# Patient Record
Sex: Female | Born: 1980 | Race: Black or African American | Hispanic: No | Marital: Married | Smoking: Former smoker
Health system: Southern US, Community
[De-identification: ages and names within clinical notes are randomized; demographics above are authoritative.]

## PROBLEM LIST (undated history)

## (undated) ENCOUNTER — Inpatient Hospital Stay (HOSPITAL_COMMUNITY): Payer: Self-pay

## (undated) DIAGNOSIS — R12 Heartburn: Secondary | ICD-10-CM

## (undated) DIAGNOSIS — R21 Rash and other nonspecific skin eruption: Secondary | ICD-10-CM

## (undated) DIAGNOSIS — E669 Obesity, unspecified: Secondary | ICD-10-CM

## (undated) DIAGNOSIS — R203 Hyperesthesia: Secondary | ICD-10-CM

## (undated) DIAGNOSIS — M069 Rheumatoid arthritis, unspecified: Secondary | ICD-10-CM

## (undated) DIAGNOSIS — E1169 Type 2 diabetes mellitus with other specified complication: Secondary | ICD-10-CM

## (undated) DIAGNOSIS — D649 Anemia, unspecified: Secondary | ICD-10-CM

## (undated) DIAGNOSIS — Z9289 Personal history of other medical treatment: Secondary | ICD-10-CM

## (undated) HISTORY — PX: BREAST BIOPSY: SHX20

---
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
## 1999-02-24 ENCOUNTER — Encounter: Admission: RE | Admit: 1999-02-24 | Discharge: 1999-02-24 | Payer: Self-pay | Admitting: Family Medicine

## 2000-05-10 ENCOUNTER — Ambulatory Visit (HOSPITAL_COMMUNITY): Admission: RE | Admit: 2000-05-10 | Discharge: 2000-05-10 | Payer: Self-pay | Admitting: Family Medicine

## 2000-06-27 ENCOUNTER — Ambulatory Visit (HOSPITAL_COMMUNITY): Admission: RE | Admit: 2000-06-27 | Discharge: 2000-06-27 | Payer: Self-pay | Admitting: Family Medicine

## 2000-06-27 ENCOUNTER — Encounter: Payer: Self-pay | Admitting: Family Medicine

## 2000-07-10 HISTORY — PX: CERVICAL CERCLAGE: SHX1329

## 2000-07-21 ENCOUNTER — Inpatient Hospital Stay (HOSPITAL_COMMUNITY): Admission: AD | Admit: 2000-07-21 | Discharge: 2000-07-21 | Payer: Self-pay | Admitting: Obstetrics

## 2000-07-25 ENCOUNTER — Encounter: Admission: RE | Admit: 2000-07-25 | Discharge: 2000-07-25 | Payer: Self-pay | Admitting: Obstetrics

## 2000-08-01 ENCOUNTER — Encounter: Admission: RE | Admit: 2000-08-01 | Discharge: 2000-08-01 | Payer: Self-pay | Admitting: Obstetrics

## 2000-08-06 ENCOUNTER — Observation Stay (HOSPITAL_COMMUNITY): Admission: RE | Admit: 2000-08-06 | Discharge: 2000-08-07 | Payer: Self-pay | Admitting: *Deleted

## 2000-08-15 ENCOUNTER — Inpatient Hospital Stay (HOSPITAL_COMMUNITY): Admission: AD | Admit: 2000-08-15 | Discharge: 2000-08-15 | Payer: Self-pay | Admitting: Obstetrics & Gynecology

## 2000-08-22 ENCOUNTER — Encounter: Admission: RE | Admit: 2000-08-22 | Discharge: 2000-08-22 | Payer: Self-pay | Admitting: Obstetrics

## 2000-09-05 ENCOUNTER — Encounter: Admission: RE | Admit: 2000-09-05 | Discharge: 2000-09-05 | Payer: Self-pay | Admitting: Obstetrics

## 2000-09-19 ENCOUNTER — Encounter: Admission: RE | Admit: 2000-09-19 | Discharge: 2000-09-19 | Payer: Self-pay | Admitting: Obstetrics

## 2000-09-23 ENCOUNTER — Inpatient Hospital Stay (HOSPITAL_COMMUNITY): Admission: AD | Admit: 2000-09-23 | Discharge: 2000-11-25 | Payer: Self-pay | Admitting: *Deleted

## 2000-09-23 ENCOUNTER — Ambulatory Visit (HOSPITAL_COMMUNITY): Admission: RE | Admit: 2000-09-23 | Discharge: 2000-09-23 | Payer: Self-pay | Admitting: Obstetrics

## 2000-09-26 ENCOUNTER — Encounter: Payer: Self-pay | Admitting: Obstetrics & Gynecology

## 2000-10-13 ENCOUNTER — Encounter: Payer: Self-pay | Admitting: Obstetrics

## 2000-10-25 ENCOUNTER — Encounter: Payer: Self-pay | Admitting: *Deleted

## 2000-11-21 ENCOUNTER — Encounter: Payer: Self-pay | Admitting: Obstetrics & Gynecology

## 2000-12-03 ENCOUNTER — Encounter (HOSPITAL_COMMUNITY): Admission: RE | Admit: 2000-12-03 | Discharge: 2001-01-02 | Payer: Self-pay | Admitting: *Deleted

## 2000-12-27 ENCOUNTER — Inpatient Hospital Stay (HOSPITAL_COMMUNITY): Admission: AD | Admit: 2000-12-27 | Discharge: 2000-12-27 | Payer: Self-pay | Admitting: Obstetrics & Gynecology

## 2001-01-17 ENCOUNTER — Encounter (HOSPITAL_COMMUNITY): Admission: RE | Admit: 2001-01-17 | Discharge: 2001-01-20 | Payer: Self-pay | Admitting: *Deleted

## 2001-01-18 ENCOUNTER — Inpatient Hospital Stay (HOSPITAL_COMMUNITY): Admission: AD | Admit: 2001-01-18 | Discharge: 2001-01-21 | Payer: Self-pay | Admitting: *Deleted

## 2001-09-21 ENCOUNTER — Inpatient Hospital Stay (HOSPITAL_COMMUNITY): Admission: AD | Admit: 2001-09-21 | Discharge: 2001-09-21 | Payer: Self-pay | Admitting: *Deleted

## 2001-09-22 ENCOUNTER — Inpatient Hospital Stay (HOSPITAL_COMMUNITY): Admission: AD | Admit: 2001-09-22 | Discharge: 2001-09-22 | Payer: Self-pay | Admitting: Obstetrics and Gynecology

## 2001-09-22 ENCOUNTER — Encounter: Payer: Self-pay | Admitting: *Deleted

## 2001-09-23 ENCOUNTER — Observation Stay (HOSPITAL_COMMUNITY): Admission: RE | Admit: 2001-09-23 | Discharge: 2001-09-24 | Payer: Self-pay | Admitting: *Deleted

## 2001-09-30 ENCOUNTER — Encounter (HOSPITAL_COMMUNITY): Admission: RE | Admit: 2001-09-30 | Discharge: 2001-10-30 | Payer: Self-pay | Admitting: *Deleted

## 2001-10-28 ENCOUNTER — Encounter: Payer: Self-pay | Admitting: *Deleted

## 2001-10-30 HISTORY — PX: CERVICAL CERCLAGE: SHX1329

## 2001-11-04 ENCOUNTER — Encounter (HOSPITAL_COMMUNITY): Admission: RE | Admit: 2001-11-04 | Discharge: 2001-12-04 | Payer: Self-pay | Admitting: *Deleted

## 2001-12-09 ENCOUNTER — Encounter (HOSPITAL_COMMUNITY): Admission: RE | Admit: 2001-12-09 | Discharge: 2002-01-08 | Payer: Self-pay | Admitting: *Deleted

## 2002-01-06 ENCOUNTER — Encounter: Payer: Self-pay | Admitting: *Deleted

## 2002-01-09 ENCOUNTER — Inpatient Hospital Stay (HOSPITAL_COMMUNITY): Admission: AD | Admit: 2002-01-09 | Discharge: 2002-01-09 | Payer: Self-pay | Admitting: Obstetrics and Gynecology

## 2002-01-13 ENCOUNTER — Inpatient Hospital Stay (HOSPITAL_COMMUNITY): Admission: AD | Admit: 2002-01-13 | Discharge: 2002-01-15 | Payer: Self-pay | Admitting: *Deleted

## 2002-01-13 ENCOUNTER — Encounter (HOSPITAL_COMMUNITY): Admission: RE | Admit: 2002-01-13 | Discharge: 2002-01-23 | Payer: Self-pay | Admitting: *Deleted

## 2002-01-28 ENCOUNTER — Encounter: Admission: RE | Admit: 2002-01-28 | Discharge: 2002-01-28 | Payer: Self-pay | Admitting: *Deleted

## 2002-01-28 ENCOUNTER — Encounter (HOSPITAL_COMMUNITY): Admission: RE | Admit: 2002-01-28 | Discharge: 2002-02-27 | Payer: Self-pay | Admitting: *Deleted

## 2002-01-29 ENCOUNTER — Inpatient Hospital Stay (HOSPITAL_COMMUNITY): Admission: AD | Admit: 2002-01-29 | Discharge: 2002-01-29 | Payer: Self-pay | Admitting: *Deleted

## 2002-02-02 ENCOUNTER — Inpatient Hospital Stay (HOSPITAL_COMMUNITY): Admission: AD | Admit: 2002-02-02 | Discharge: 2002-02-05 | Payer: Self-pay | Admitting: Obstetrics and Gynecology

## 2002-02-12 ENCOUNTER — Encounter: Admission: RE | Admit: 2002-02-12 | Discharge: 2002-02-12 | Payer: Self-pay | Admitting: *Deleted

## 2002-02-19 ENCOUNTER — Encounter: Admission: RE | Admit: 2002-02-19 | Discharge: 2002-02-19 | Payer: Self-pay | Admitting: *Deleted

## 2002-02-26 ENCOUNTER — Encounter: Admission: RE | Admit: 2002-02-26 | Discharge: 2002-02-26 | Payer: Self-pay | Admitting: *Deleted

## 2002-03-05 ENCOUNTER — Encounter: Admission: RE | Admit: 2002-03-05 | Discharge: 2002-03-05 | Payer: Self-pay | Admitting: *Deleted

## 2002-03-12 ENCOUNTER — Encounter: Admission: RE | Admit: 2002-03-12 | Discharge: 2002-03-12 | Payer: Self-pay | Admitting: *Deleted

## 2002-03-14 ENCOUNTER — Inpatient Hospital Stay (HOSPITAL_COMMUNITY): Admission: AD | Admit: 2002-03-14 | Discharge: 2002-03-14 | Payer: Self-pay | Admitting: Family Medicine

## 2002-03-26 ENCOUNTER — Inpatient Hospital Stay (HOSPITAL_COMMUNITY): Admission: AD | Admit: 2002-03-26 | Discharge: 2002-03-26 | Payer: Self-pay | Admitting: Family Medicine

## 2002-03-27 ENCOUNTER — Inpatient Hospital Stay (HOSPITAL_COMMUNITY): Admission: AD | Admit: 2002-03-27 | Discharge: 2002-03-27 | Payer: Self-pay | Admitting: Family Medicine

## 2002-03-28 ENCOUNTER — Inpatient Hospital Stay (HOSPITAL_COMMUNITY): Admission: AD | Admit: 2002-03-28 | Discharge: 2002-03-30 | Payer: Self-pay | Admitting: *Deleted

## 2004-04-28 ENCOUNTER — Ambulatory Visit: Payer: Self-pay | Admitting: Family Medicine

## 2004-04-28 ENCOUNTER — Ambulatory Visit: Payer: Self-pay | Admitting: *Deleted

## 2005-05-31 ENCOUNTER — Ambulatory Visit: Payer: Self-pay | Admitting: Family Medicine

## 2005-07-08 ENCOUNTER — Emergency Department (HOSPITAL_COMMUNITY): Admission: EM | Admit: 2005-07-08 | Discharge: 2005-07-08 | Payer: Self-pay | Admitting: Emergency Medicine

## 2006-04-04 ENCOUNTER — Inpatient Hospital Stay (HOSPITAL_COMMUNITY): Admission: AD | Admit: 2006-04-04 | Discharge: 2006-04-05 | Payer: Self-pay | Admitting: Gynecology

## 2006-04-17 ENCOUNTER — Ambulatory Visit (HOSPITAL_COMMUNITY): Admission: RE | Admit: 2006-04-17 | Discharge: 2006-04-17 | Payer: Self-pay | Admitting: Obstetrics

## 2006-05-01 ENCOUNTER — Ambulatory Visit (HOSPITAL_COMMUNITY): Admission: RE | Admit: 2006-05-01 | Discharge: 2006-05-01 | Payer: Self-pay | Admitting: Obstetrics

## 2006-05-02 ENCOUNTER — Ambulatory Visit (HOSPITAL_COMMUNITY): Admission: RE | Admit: 2006-05-02 | Discharge: 2006-05-03 | Payer: Self-pay | Admitting: Obstetrics

## 2006-05-02 HISTORY — PX: CERVICAL CERCLAGE: SHX1329

## 2006-05-09 ENCOUNTER — Encounter (HOSPITAL_COMMUNITY): Admission: AD | Admit: 2006-05-09 | Discharge: 2006-05-09 | Payer: Self-pay | Admitting: Obstetrics

## 2006-06-16 ENCOUNTER — Inpatient Hospital Stay (HOSPITAL_COMMUNITY): Admission: AD | Admit: 2006-06-16 | Discharge: 2006-06-21 | Payer: Self-pay | Admitting: Obstetrics

## 2006-07-18 ENCOUNTER — Inpatient Hospital Stay (HOSPITAL_COMMUNITY): Admission: AD | Admit: 2006-07-18 | Discharge: 2006-07-18 | Payer: Self-pay | Admitting: Obstetrics & Gynecology

## 2006-09-12 ENCOUNTER — Inpatient Hospital Stay (HOSPITAL_COMMUNITY): Admission: AD | Admit: 2006-09-12 | Discharge: 2006-09-12 | Payer: Self-pay | Admitting: Obstetrics

## 2006-09-13 ENCOUNTER — Inpatient Hospital Stay (HOSPITAL_COMMUNITY): Admission: AD | Admit: 2006-09-13 | Discharge: 2006-09-14 | Payer: Self-pay | Admitting: Obstetrics & Gynecology

## 2006-09-30 ENCOUNTER — Inpatient Hospital Stay (HOSPITAL_COMMUNITY): Admission: AD | Admit: 2006-09-30 | Discharge: 2006-10-02 | Payer: Self-pay | Admitting: Obstetrics

## 2007-01-12 ENCOUNTER — Emergency Department (HOSPITAL_COMMUNITY): Admission: EM | Admit: 2007-01-12 | Discharge: 2007-01-12 | Payer: Self-pay | Admitting: Emergency Medicine

## 2009-10-20 ENCOUNTER — Emergency Department (HOSPITAL_COMMUNITY): Admission: EM | Admit: 2009-10-20 | Discharge: 2009-10-20 | Payer: Self-pay | Admitting: Emergency Medicine

## 2010-03-23 LAB — DIFFERENTIAL
Basophils Absolute: 0.1 10*3/uL (ref 0.0–0.1)
Basophils Relative: 1 % (ref 0–1)
Eosinophils Absolute: 0.1 10*3/uL (ref 0.0–0.7)
Lymphocytes Relative: 30 % (ref 12–46)
Neutrophils Relative %: 59 % (ref 43–77)

## 2010-03-23 LAB — COMPREHENSIVE METABOLIC PANEL
Albumin: 3.7 g/dL (ref 3.5–5.2)
BUN: 11 mg/dL (ref 6–23)
Chloride: 105 mEq/L (ref 96–112)
Creatinine, Ser: 0.78 mg/dL (ref 0.4–1.2)
GFR calc non Af Amer: >60 mL/min (ref >60)
Glucose, Bld: 94 mg/dL (ref 70–99)
Total Bilirubin: 0.2 mg/dL — ABNORMAL LOW (ref 0.3–1.2)

## 2010-03-23 LAB — CBC
HCT: 25.2 % — ABNORMAL LOW (ref 36.0–46.0)
MCV: 64.3 fL — ABNORMAL LOW (ref 78.0–100.0)
RDW: 20.2 % — ABNORMAL HIGH (ref 11.5–15.5)
WBC: 6 10*3/uL (ref 4.0–10.5)

## 2010-03-23 LAB — URINALYSIS, ROUTINE W REFLEX MICROSCOPIC
Ketones, ur: NEGATIVE mg/dL
Nitrite: NEGATIVE
Specific Gravity, Urine: 1.014 (ref 1.005–1.030)
pH: 7.5 (ref 5.0–8.0)

## 2010-03-23 LAB — URINE MICROSCOPIC-ADD ON

## 2010-03-23 LAB — PREGNANCY, URINE

## 2010-04-19 ENCOUNTER — Emergency Department (HOSPITAL_COMMUNITY)
Admission: EM | Admit: 2010-04-19 | Discharge: 2010-04-19 | Disposition: A | Discharge status: Home / Self Care | Payer: Self-pay | Attending: Emergency Medicine | Admitting: Emergency Medicine

## 2010-04-19 ENCOUNTER — Emergency Department (HOSPITAL_COMMUNITY): Payer: Self-pay

## 2010-04-19 DIAGNOSIS — R059 Cough, unspecified: Secondary | ICD-10-CM | POA: Insufficient documentation

## 2010-04-19 DIAGNOSIS — R05 Cough: Secondary | ICD-10-CM | POA: Insufficient documentation

## 2010-04-19 DIAGNOSIS — J3489 Other specified disorders of nose and nasal sinuses: Secondary | ICD-10-CM | POA: Insufficient documentation

## 2010-04-19 DIAGNOSIS — R071 Chest pain on breathing: Secondary | ICD-10-CM | POA: Insufficient documentation

## 2010-04-19 DIAGNOSIS — J40 Bronchitis, not specified as acute or chronic: Secondary | ICD-10-CM | POA: Insufficient documentation

## 2010-04-19 DIAGNOSIS — IMO0001 Reserved for inherently not codable concepts without codable children: Secondary | ICD-10-CM | POA: Insufficient documentation

## 2010-04-19 DIAGNOSIS — R509 Fever, unspecified: Secondary | ICD-10-CM | POA: Insufficient documentation

## 2010-05-23 NOTE — H&P (Signed)
NAME:  Tamara Green, Tamara Green NO.:  [id number]   MEDICAL RECORD NO.:  [id number]          PATIENT TYPE:  INP   LOCATION:  9161                          FACILITY:  [facility]   PHYSICIAN:  Roseanna Rainbow, M.D.DATE OF BIRTH:  26-Nov-1980   DATE OF ADMISSION:  09/30/2006  DATE OF DISCHARGE:                              HISTORY & PHYSICAL   CHIEF COMPLAINT:  The patient is a 30 year old gravida 5 para 2 with an  estimated date of confinement September 26, with an intrauterine  pregnancy at 39+ weeks, complaining of uterine contractions.   HISTORY OF PRESENT ILLNESS:  Please see the above.   ALLERGIES:  DARVOCET   PRENATAL SCREENINGS:  Chlamydia DNA probe negative.  Urine culture and  sensitivity no growth.  GC probe negative.  One-hour GCT 154.  CVS  negative on August 26.  Hepatitis B surface antigen negative.  Hematocrit 28.8.  Hemoglobin 9.8.  Recent hemoglobin on July 8 was 8.8.   MEDICATIONS:  Please see the medication reconciliation form.   OB RISK FACTORS:  Cervical insufficiency, history of a pre-term  delivery.  History of intrauterine fetal demise.  History of large  gestational age infant, history of gestational diabetes.  Anemia.  RH  negative, nonsensitized.   PAST OBSTETRICAL HISTORY:  In 1999, she was delivered of a  21-week  fetal demise.  In January 2000, she was delivered of a 21-week fetal  demise.  In January of 2003, she was delivered of a live-born female, 6  pounds 12 ounces at 35 weeks.  A cerclage  had been placed  prophylactically and she had gestational diabetes.  In March of 2004 she  was delivered of a live-born female 9 pounds 6 ounce infant with a  cerclage placed prophylactically during that pregnancy.  HIV is  nonreactive.  Pap smear H cell, platelets 306,000.  Blood type is O  negative.  Antibody screen negative.  RPR was nonreactive.  Rubella  immune.  Sickle Cell negative.  Varicella immune.   PAST GYN HISTORY:  Noncontributory.   PAST MEDICAL HISTORY:  No significant history of medical diseases.   PAST SURGICAL HISTORY:  Benign right breast biopsy.  Please see the  above.   SOCIAL HISTORY:  She is  married, living with her spouse, does not give  any significant history of alcohol use.  She has no significant smoking  history.  Denies illicit drug use.   FAMILY HISTORY:  Adult-onset diabetes, prostate cancer.   PHYSICAL EXAMINATION:  Vital signs stable, afebrile.  Fetal heart rate  seems reassuring.  Tocodynamometry, irregular uterine contractions,  sterile vaginal exam.  Cervix is 4-5 cm dilated, 60-80% effaced.  Membranes were artificially ruptured for clear fluid.   ASSESSMENT:  Multipara at term, early labor, fetal heart tracing  consistent with fetal wall being planned.  Admission, augmentation of  labor with low-dose Pitocin per protocol.  Will monitor progress.      Roseanna Rainbow, M.D.  Electronically Signed     LAJ/MEDQ  D:  09/30/2006  T:  09/30/2006  Job:  [id number]

## 2010-05-23 NOTE — Consult Note (Signed)
NAME:  Tamara Green, Tamara Green NO.:  [id number]   MEDICAL RECORD NO.:  [id number]          PATIENT TYPE:   LOCATION:                                 FACILITY:   PHYSICIAN:  Karol T. Lazarus Salines, M.D.      DATE OF BIRTH:   DATE OF CONSULTATION:  01/12/2007  DATE OF DISCHARGE:                                 CONSULTATION   CHIEF COMPLAINT:  Sore throat.   HISTORY:  A 30 year old black female started having a sore throat  approximately 9 days ago.  It seemed to ease up after a couple days and  then has been getting slowly worse.  The last couple of days, it has  been focal on the right side of her throat with some tenderness in her  right neck, pain shooting into her right ear, trismus, and pain with  swallowing.  She was taking some liquids.  No documented fevers.  She  has maybe one episode of sore throat per year.  This time she is strep  positive.  According to her and her husband, she does snore regularly  with or without a sore throat.  She has never had a prior peritonsillar  abscess.  She is not diabetic or otherwise immune compromised.   PAST MEDICAL HISTORY:  No known allergies.  She has not received any  treatment for this thus far.   SOCIAL HISTORY:  She had just had a 76-month-old baby.  She lives with  her husband.   FAMILY HISTORY:  Noncontributory.   REVIEW OF SYSTEMS:  Noncontributory.   PHYSICAL EXAMINATION:  GENERAL:  This is a heavy-set, distressed  appearing, adult black female.  She has a strong fetor oris consistent  with anaerobic infection.  She is not warm to touch.  Mental status is  appropriate.  She hears well in conversational speech.  Respirations  unlabored, but voice has a hot potato sound.  HEENT:  Head is atraumatic.  Cranial nerves intact.  Ear canals are  clear with normal aerated drums.  Anterior nose is clear.  Oral cavity  reveals a bulky tongue with normal mandible maxilla.  She has a fleshy  pharynx with edematous bulging of the  right soft palate and shift of the  tonsil and the uvula towards the left.  No active exudate seen.  NECK:  Supple, tender in the right jugulodigastric region without  discrete nodes.   LABORATORY STUDIES:  White blood cell count 11,500.   X-RAYS:  A CT scan of the pharynx shows a large phlegmon but no discrete  abscess.   IMPRESSION:  Right peritonsillar abscess more obvious clinically done on  CT scan.   PLAN:  I discussed this with her husband.  Today, we need to drain this  and I would prefer to do this here at the bedside.  She is agreeable.   Risk and complications were discussed.  Questions were answered and  informed consent was obtained.  Anesthesia was begun with Cetacaine  spray followed by 8 mL of 1% Xylocaine with 1:100,000 epinephrine.  After allowing several minutes for this to take effect, a 2-cm crescent  incision was  made over the superior pole of the right tonsil and carried  down and an abscess cavity was encountered and evacuated.  The opening  was enlarged with the hemostat which caused her some discomfort.  Cavity  was thoroughly evacuated.  Following the procedure, she gargled with a  50/50 mixture of peroxide and saline.   I gave her a prescription for Tylenol with Codeine liquid pain relief,  amoxicillin liquid 1 gram t.i.d. for 3 days followed by amoxicillin 500  mg in pill form for an additional week.  I will see her back in 2 weeks  if she is doing well, sooner for problems.  She can advance activity and  diet as comfortable.  Given the past history of at least some episodes  of strep throat and tonsillitis, and a suggestion of some obstructive  hypertrophy, she may be better served to have her tonsils removed.  We  will discuss this further with I see her back.  She understands and  agrees.      Gloris Manchester. Lazarus Salines, M.D.  Electronically Signed     KTW/MEDQ  D:  01/12/2007  T:  01/13/2007  Job:  [id number]

## 2010-05-26 NOTE — Discharge Summary (Signed)
   NAME:  Tamara Green, Tamara Green                ACCOUNT NO.:  [id number]   MEDICAL RECORD NO.:  [id number]                   PATIENT TYPE:  INP   LOCATION:  9156                                 FACILITY:  [facility]   PHYSICIAN:  Conni Elliot, M.D.             DATE OF BIRTH:  22-Jan-1980   DATE OF ADMISSION:  01/13/2002  DATE OF DISCHARGE:  01/15/2002                                 DISCHARGE SUMMARY   DISCHARGE DIAGNOSES:  1. Gravida 4, para 1-2-0-1 at 29 weeks and 3 days with preterm uterine     contractions.  2. Status post cerclage in September 2003.  3. Positive group B Strep.  4. Anemia.   DISCHARGE MEDICATIONS:  Prenatal vitamins daily.   FOLLOW UP:  The patient is to follow up with Conni Elliot, M.D. on  Tuesday at 11 a.m.   HOSPITAL COURSE:  This 30 year old gravida 4, para 1-2-0-1 presented at 71  weeks and 0 days gestational age with complaints of pain on her right side  and told to come in for evaluation to [facility].  The patient had  been as far as multiple times with preterm labor and was now admitted for  observation for preterm labor as well as to receive IV Unasyn for positive  group B Strep.  The patient was monitored for 48 hours and had no more  uterine contractions during hospitalization.  She completed a two day course  of Unasyn for group B Strep.  She did have microcytic anemia on examination,  but was not started on ferrous sulfate.  She was discharged on prenatal  vitamins and to follow up with Conni Elliot, M.D.   LABORATORY DATA:  White cell count 7.1, hemoglobin 8.8, hematocrit 26.8,  platelets 270,000.     Billey Gosling, M.D.                       Conni Elliot, M.D.    AS/MEDQ  D:  03/02/2002  T:  03/02/2002  Job:  [id number]

## 2010-05-26 NOTE — Op Note (Signed)
[facility]  Patient:    Tamara Green, Tamara Green                      MRN: [id number] Proc. Date: 07/10/00 Adm. Date:  [id number] Attending:  Michaelle Copas                           Operative Report  2PREOPERATIVE DIAGNOSIS:       Intrauterine pregnancy at 11 to [redacted] weeks gestation with decreased cervical resistance.  POSTOPERATIVE DIAGNOSIS:      Intrauterine pregnancy at 11 to [redacted] weeks gestation with decreased cervical resistance.  OPERATION:                    Modified [employer] cerclage.  SURGEON:                      Conni Elliot, M.D.  ANESTHESIA:                   Spinal.  DESCRIPTION OF PROCEDURE:     After placing the patient under spinal anesthetic, the patient was placed in the supine dorsal lithotomy position. The perineum and vagina were prepped with multiple layers of Betadine solution. A weighted speculum was placed in the posterior vagina. Cleocin douche was provided. Anterior cervix was grasped with a sponge stick. The cerclage was placed using #4 silk, double stranded, starting at 12 oclock in the counterclockwise fashion. This was tied anteriorly. Estimated blood loss was less than 5 cc. Needle and sponge counts correct. DD:  08/06/00 TD:  08/06/00 Job: [id number]

## 2010-05-26 NOTE — Discharge Summary (Signed)
   NAME:  Tamara Green, Tamara Green                ACCOUNT NO.:  [id number]   MEDICAL RECORD NO.:  [id number]                   PATIENT TYPE:  INP   LOCATION:  9152                                 FACILITY:  [facility]   PHYSICIAN:  Phil D. Okey Dupre, M.D.                  DATE OF BIRTH:  06-16-1980   DATE OF ADMISSION:  02/02/2002  DATE OF DISCHARGE:  02/05/2002                                 DISCHARGE SUMMARY   DISCHARGE DIAGNOSES:  1. Preterm uterine contractions.  2. Status post Cerclage for cervical incompetence.   DISCHARGE MEDICATIONS:  1. Procardia 10 mg p.o. q.6h.  2. Prenatal vitamins 1 tablet p.o. daily.   BRIEF HISTORY:  The patient is a 30 year old G4, P0-3-0-1, who presents at  32 weeks 0 days with the complaint of contracting all day.  She describes  irregular low pelvic and low back pain and the feeling of balling up in the  area of the fundus.  She saw Dr. Gavin Potters at high-risk clinic and evaluated  for her cervix to be 2 cm long, full thickness, and ballottable.  She was  noted to be having some contractions and referred to [facility] for  admission.   HOSPITAL COURSE:  PRETERM LABOR:  Upon arrival to [facility], she was given terbutaline  0.25 mg subcutaneously and then started on Procardia 10 mg p.o. q.6h.  Her  uterine contractions essentially stopped while in the hospital, and she  would only have occasional intermittent contraction.  Throughout her  hospital stay, fetal heart rate was reassuring, and she will be discharged  home on Procardia.  On admission, she did received both doses of  betamethasone while in the hospital.   DIET:  No restrictions.   ACTIVITY:  The patient was advised to use bedrest.   FOLLOW UP:  The patient was advised to call for appointment at high-risk  clinic.      Rodolph Bong, M.D.                        Phil D. Okey Dupre, M.D.    AK/MEDQ  D:  02/05/2002  T:  02/05/2002  Job:  [id number]

## 2010-05-26 NOTE — Op Note (Signed)
   NAME:  SHATAYA, WINKLES NO.:  [id number]   MEDICAL RECORD NO.:  [id number]                   PATIENT TYPE:   LOCATION:                                       FACILITY:   PHYSICIAN:  Conni Elliot, M.D.             DATE OF BIRTH:   DATE OF PROCEDURE:  DATE OF DISCHARGE:                                 OPERATIVE REPORT   PREOPERATIVE DIAGNOSES:  Decreased cervical resistance.   POSTOPERATIVE DIAGNOSES:  Decreased cervical resistance.   OPERATIVE PROCEDURE:  Cervical cerclage.   OPERATOR:  Conni Elliot, M.D.   ANESTHESIA:  Spinal.   OPERATIVE PROCEDURE:  After the patient was placed in dorsal lithotomy  position, perineum and vagina were prepped with Betadine solution.  Weighted  speculum was placed in the posterior vagina.  Foley catheter was placed in  the bladder.  Anterior cervix was grasped with sponge stick.  A cervical  cerclage was placed using number 4 silk double stranded.  Cerclage was  placed starting at 12 o'clock going counterclockwise, tied at 12 o'clock.   ESTIMATED BLOOD LOSS:  Less than 50 cc.                                                Conni Elliot, M.D.    ASG/MEDQ  D:  10/30/2001  T:  10/30/2001  Job:  [id number]

## 2010-05-26 NOTE — Op Note (Signed)
NAME:  Tamara Green, Tamara Green NO.:  [id number]   MEDICAL RECORD NO.:  [id number]          PATIENT TYPE:  AMB   LOCATION:  SDC                           [facility]ACILITY:  [facility]   PHYSICIAN:  Charles A. Clearance Coots, M.D.DATE OF BIRTH:  1980-05-01   DATE OF PROCEDURE:  05/02/2006  DATE OF DISCHARGE:                               OPERATIVE REPORT   PREOPERATIVE DIAGNOSIS:  17 weeks' gestation, incompetent cervix.   POSTOPERATIVE DIAGNOSIS:  17 weeks' gestation, incompetent cervix.   PROCEDURE:  [employer] cervical cerclage.   SURGEON:  Coral Ceo, M.D.   ANESTHESIA:  Spinal.   ESTIMATED BLOOD LOSS:  Minimal.   COMPLICATIONS:  None.   OPERATION:  The patient was brought to the operating room and after  satisfactory spinal anesthesia, the patient was placed in stirrups and  the vagina was prepped and draped in the usual sterile fashion.  A  sterile weighted speculum was inserted vaginal vault, cervix was  isolated with the aid of a Deaver retractor.  The anterior lip of the  cervix was grasped with a ring forceps.  A [employer] cervical cerclage  was then placed in routine fashion with double swaged on #4 silk suture  with a medium Mayo needle starting at 12 o'clock and then going  counterclockwise.  The suture was then cinched down securely and  multiple knots were placed and the suture was left approximately 1 inch  in length for future removal of the cerclage.  There was no active  bleeding at the conclusion of the procedure.  The vagina was then  douched with a solution of 150 mg capsule of clindamycin and 30 mL of  normal saline.  All instruments were then retired.  The patient  tolerated the procedure well, was transported to the recovery room in  satisfactory condition.      Charles A. Clearance Coots, M.D.  Electronically Signed     CAH/MEDQ  D:  05/02/2006  T:  05/02/2006  Job:  [id number]

## 2010-05-26 NOTE — Discharge Summary (Signed)
[facility]  Patient:    Tamara Green, Tamara Green Visit Number: [id number] MRN: [id number]          Service Type: Attending:  Conni Elliot, M.D. Dictated by:   Michell Heinrich, M.D. Adm. Date:  09/23/00 Disc. Date: 11/25/00                             Discharge Summary  ADMISSION DIAGNOSES:             1. Cervical incompetence.                                  2. Preterm labor.                                  3. Cervicitis.  DISCHARGE DIAGNOSES:             1. Cervical incompetence.                                  2. Preterm labor.                                  3. Cervicitis.                                  4. Gestational diabetes mellitus.  DISCHARGE MEDICATIONS:           1. Procardia XL 60 mg one tab daily.                                  2. Insulin NPH 10 units in the morning and                                     12 units every evening.  SERVICE:                         OB Teaching Service.  ATTENDING:                       Conni Elliot, M.D.  RESIDENT:                        Michell Heinrich, M.D.  FELLOW:                          Ed Blalock. Burnadette Peter, M.D.  CONSULTATIONS:                   None.  PROCEDURES:                      1. Limited OB ultrasound on September 23, 2000 showed a cervical length of 2.3 cm                                     with cerclage in place and 0.7 cm length                                     proximal to the cerclage with dynamic                                     changes.                                  2. Limited OB ultrasound on October 13, 2000                                     showed single intrauterine pregnancy in                                     breech position.  Also a shortened cervix                                     with funneling seen down to the level of                                     the intact cerclage.           3. Complete OB ultrasound on November 21, 2000 showed appropriate interval growth,                                     estimated fetal weight of 1121 g which was                                     65th percentile for 27 weeks.                                     Presentation was breech.  Amniotic fluid                                     was normal.  Again, shortened cervical  length was noted.  HISTORY AND PHYSICAL:            For complete H&P, please see resident H&P in chart.  Briefly, this is a 30 year old female G3, P0-1-1-0 with a history of two prior second-trimester fetal losses and placement of a cervical cerclage at 12 weeks this pregnancy who presented for routine anatomic ultrasound.  She was not having any contractions or vaginal bleeding.  Her cervix was noted to be shortened and have dynamic changes.  The remainder of the physical exam was normal and fetal heart rate was in the 150s and was reassuring.  She was admitted to the hospital for cervical weakness and no dynamic cervical changes and hospital course is as follows by problem.  HOSPITAL COURSE:                 #1 - CERVICAL INCOMPETENCE/PRETERM LABOR: Initial lab work supported a diagnosis of cervicitis and she was begun on Unasyn.  She was also placed on Motrin, placed in Trendelenburg on complete bed rest and monitored closely.  She continued to have mild uterine activity on tocometry but serial cervical checks did not reveal any significant change. Over the course of the hospitalization, her cervix lengthened by exam to approximately 2 cm in length and was thought to be thickened and less dynamic. Her uterine activity diminished.  She finished a course of Unasyn and was discharged home on Procardia.                                   #2 - GESTATIONAL DIABETES MELLITUS:  The patient on admission was at 19-4/7 weeks by first-trimester  ultrasound and stayed in the hospital approximately 2 months.  At the 24-week mark she did receive a 1-hour glucose tolerance test which was 135.  Subsequent fasting blood sugars were borderline and it became apparent that she was developing gestational diabetes mellitus.  She was begun on nightly NPH Insulin and subsequent checks of her daytime sugars revealed she needed daytime dosing of NPH as well.  CBGs were in good control with the doses of insulin during the hospitalization and upon discharge.  She was discharged home on the previously mentioned dose of insulin.                                   #3 - FETAL MONITORING:  The fetal heart rate monitoring during this hospitalization revealed reassuring fetal heart tracings and growth findings were as dictated in the procedures section of this dictation.  The patient was discharged home on November 25, 2000 with instructions for complete bed rest and to eat a diabetic diet and to follow up in [facility]U at [facility] to see Dr. Gavin Potters every Tuesday beginning December 03, 2000 at 10:30 a.m. Dictated by:   Michell Heinrich, M.D. Attending:  Conni Elliot, M.D. DD:  06/05/01 TD:  06/07/01 Job: [id number]

## 2010-05-26 NOTE — Discharge Summary (Signed)
NAME:  Tamara Green, Tamara Green NO.:  [id number]   MEDICAL RECORD NO.:  [id number]          PATIENT TYPE:  INP   LOCATION:  9158                          [facility]ACILITY:  [facility]   PHYSICIAN:  Roseanna Rainbow, M.D.DATE OF BIRTH:  1980-08-13   DATE OF ADMISSION:  06/16/2006  DATE OF DISCHARGE:  06/21/2006                               DISCHARGE SUMMARY   CHIEF COMPLAINT:  The patient is a 30 year old, gravida 5, para 2 with  an estimated date of confinement of October 04, 2006 with an  intrauterine pregnancy at 24 weeks complaining of contractions.   HISTORY OF PRESENT ILLNESS:  The patient reports recent sexual  intercourse.  OB risk factors:  Cervical insufficiency with a cerclage  in situ.   PAST SURGICAL HISTORY:  Cerclage.   PAST MEDICAL HISTORY:  She denies.   MEDICATIONS:  Prenatal vitamins.   ALLERGIES:  No known drug allergies.   SOCIAL HISTORY:  She is single.   On physical exam, vital signs stable, afebrile.  HEART:  Regular rate and rhythm.  ABDOMEN:  Gravid, nontender.  CERVIX:  Closed, long, with the presenting part high.  External fetal  monitoring reassuring.  Uterine contractions every 6 to 8 minutes.   ASSESSMENT:  Intrauterine pregnancy at 24+ weeks.  Cervical  insufficiency.  Now with threatened preterm labor.   PLAN:  Admission, bedrest, tocolysis.   The patient was admitted.  Her 17-hydroxyprogesterone injections were  continued.  She was started on clindamycin parenterally.  She continued  to have irregular contractions.  An ultrasound on June 13 demonstrated a  normal amniotic fluid index, estimated fetal weight percentile 61st  percentile.  The cervix was 4.18 cm in length.  She was subsequently  discharged to home.   DISCHARGE DIAGNOSES:  Cervical insufficiency, preterm contractions.   CONDITION:  Stable.   DIET:  Regular.   ACTIVITY:  Pelvic rest, decreased activity.   DISPOSITION:  The patient was to follow up in the office in  1 week.      Roseanna Rainbow, M.D.  Electronically Signed     LAJ/MEDQ  D:  07/26/2006  T:  07/27/2006  Job:  [id number]

## 2010-09-28 LAB — I-STAT 8, (EC8 V) (CONVERTED LAB)
BUN: 9
Chloride: 105
Glucose, Bld: 117 — ABNORMAL HIGH
HCT: 35 — ABNORMAL LOW
Hemoglobin: 11.9 — ABNORMAL LOW
Operator id: 151321
Potassium: 3.5
Sodium: 139

## 2010-09-28 LAB — DIFFERENTIAL
Basophils Absolute: 0
Basophils Relative: 0
Lymphocytes Relative: 14
Monocytes Absolute: 0.9
Monocytes Relative: 8
Neutro Abs: 8.8 — ABNORMAL HIGH
Neutrophils Relative %: 76

## 2010-09-28 LAB — CBC
Hemoglobin: 10 — ABNORMAL LOW
RBC: 4.02
RDW: 16.5 — ABNORMAL HIGH

## 2010-10-19 LAB — RH IMMUNE GLOB WKUP(>/=20WKS)(NOT WOMEN'S HOSP): Fetal Screen: NEGATIVE

## 2010-10-19 LAB — CBC
HCT: 26 — ABNORMAL LOW
Hemoglobin: 7.4 — CL
Hemoglobin: 8.9 — ABNORMAL LOW
MCHC: 33.7
Platelets: 235
RBC: 3.57 — ABNORMAL LOW
RDW: 18.5 — ABNORMAL HIGH
RDW: 18.9 — ABNORMAL HIGH

## 2010-10-19 LAB — CCBB MATERNAL DONOR DRAW

## 2010-10-20 LAB — CBC
Hemoglobin: 9.1 — ABNORMAL LOW
RBC: 3.59 — ABNORMAL LOW
RDW: 18.9 — ABNORMAL HIGH

## 2010-10-24 LAB — RH IMMUNE GLOBULIN WORKUP (NOT WOMEN'S HOSP)
ABO/RH(D): O NEG
Antibody Screen: NEGATIVE

## 2010-10-26 LAB — URINE MICROSCOPIC-ADD ON

## 2010-10-26 LAB — URINALYSIS, ROUTINE W REFLEX MICROSCOPIC
Bilirubin Urine: NEGATIVE
Glucose, UA: NEGATIVE
Hgb urine dipstick: NEGATIVE
Specific Gravity, Urine: 1.01
Urobilinogen, UA: 0.2

## 2010-10-26 LAB — WET PREP, GENITAL
Trich, Wet Prep: NONE SEEN
Yeast Wet Prep HPF POC: NONE SEEN

## 2010-10-26 LAB — FETAL FIBRONECTIN: Fetal Fibronectin: NEGATIVE

## 2012-01-04 ENCOUNTER — Encounter (HOSPITAL_COMMUNITY): Payer: Self-pay | Admitting: Emergency Medicine

## 2012-01-04 DIAGNOSIS — R42 Dizziness and giddiness: Secondary | ICD-10-CM | POA: Insufficient documentation

## 2012-01-04 DIAGNOSIS — R630 Anorexia: Secondary | ICD-10-CM | POA: Insufficient documentation

## 2012-01-04 DIAGNOSIS — M069 Rheumatoid arthritis, unspecified: Secondary | ICD-10-CM | POA: Insufficient documentation

## 2012-01-04 DIAGNOSIS — R059 Cough, unspecified: Secondary | ICD-10-CM | POA: Insufficient documentation

## 2012-01-04 DIAGNOSIS — R05 Cough: Secondary | ICD-10-CM | POA: Insufficient documentation

## 2012-01-04 DIAGNOSIS — R6883 Chills (without fever): Secondary | ICD-10-CM | POA: Insufficient documentation

## 2012-01-04 DIAGNOSIS — R12 Heartburn: Secondary | ICD-10-CM | POA: Insufficient documentation

## 2012-01-04 DIAGNOSIS — D649 Anemia, unspecified: Secondary | ICD-10-CM | POA: Insufficient documentation

## 2012-01-04 DIAGNOSIS — Z79899 Other long term (current) drug therapy: Secondary | ICD-10-CM | POA: Insufficient documentation

## 2012-01-04 DIAGNOSIS — R52 Pain, unspecified: Secondary | ICD-10-CM | POA: Insufficient documentation

## 2012-01-04 DIAGNOSIS — R112 Nausea with vomiting, unspecified: Secondary | ICD-10-CM | POA: Insufficient documentation

## 2012-01-04 DIAGNOSIS — E860 Dehydration: Secondary | ICD-10-CM | POA: Insufficient documentation

## 2012-01-04 DIAGNOSIS — J111 Influenza due to unidentified influenza virus with other respiratory manifestations: Secondary | ICD-10-CM | POA: Insufficient documentation

## 2012-01-04 NOTE — ED Notes (Signed)
PT. REPORTS EMESIS WITH BODY ACHES , CHILLS ,POOR APPETITE FOR SEVERAL DAYS , DENIES FEVER, STATES FAMILY MEMBERS HAVE THE SAME SYMPTOMS.

## 2012-01-05 ENCOUNTER — Encounter (HOSPITAL_COMMUNITY): Payer: Self-pay | Admitting: Emergency Medicine

## 2012-01-05 ENCOUNTER — Emergency Department (HOSPITAL_COMMUNITY)
Admission: EM | Admit: 2012-01-05 | Discharge: 2012-01-05 | Disposition: A | Discharge status: Home / Self Care | Payer: Self-pay | Attending: Emergency Medicine | Admitting: Emergency Medicine

## 2012-01-05 DIAGNOSIS — J111 Influenza due to unidentified influenza virus with other respiratory manifestations: Secondary | ICD-10-CM

## 2012-01-05 DIAGNOSIS — D649 Anemia, unspecified: Secondary | ICD-10-CM

## 2012-01-05 DIAGNOSIS — E860 Dehydration: Secondary | ICD-10-CM

## 2012-01-05 HISTORY — DX: Rheumatoid arthritis, unspecified: M06.9

## 2012-01-05 HISTORY — DX: Anemia, unspecified: D64.9

## 2012-01-05 LAB — URINE MICROSCOPIC-ADD ON

## 2012-01-05 LAB — URINALYSIS, ROUTINE W REFLEX MICROSCOPIC
Glucose, UA: NEGATIVE mg/dL
Ketones, ur: 15 mg/dL — AB
Nitrite: NEGATIVE
Protein, ur: 30 mg/dL — AB
pH: 6 (ref 5.0–8.0)

## 2012-01-05 LAB — COMPREHENSIVE METABOLIC PANEL
Albumin: 3.4 g/dL — ABNORMAL LOW (ref 3.5–5.2)
BUN: 8 mg/dL (ref 6–23)
Calcium: 8.7 mg/dL (ref 8.4–10.5)
Creatinine, Ser: 0.84 mg/dL (ref 0.50–1.10)
GFR calc Af Amer: >90 mL/min (ref >90)
Glucose, Bld: 132 mg/dL — ABNORMAL HIGH (ref 70–99)
Total Protein: 8.3 g/dL (ref 6.0–8.3)

## 2012-01-05 LAB — CBC WITH DIFFERENTIAL/PLATELET
Basophils Absolute: 0 10*3/uL (ref 0.0–0.1)
Basophils Relative: 0 % (ref 0–1)
Eosinophils Absolute: 0 10*3/uL (ref 0.0–0.7)
HCT: 26.5 % — ABNORMAL LOW (ref 36.0–46.0)
Lymphocytes Relative: 36 % (ref 12–46)
Lymphs Abs: 1.2 10*3/uL (ref 0.7–4.0)
MCH: 20.6 pg — ABNORMAL LOW (ref 26.0–34.0)
MCHC: 30.6 g/dL (ref 30.0–36.0)
MCV: 67.4 fL — ABNORMAL LOW (ref 78.0–100.0)
Monocytes Absolute: 0.4 10*3/uL (ref 0.1–1.0)
Neutro Abs: 1.7 10*3/uL (ref 1.7–7.7)
RDW: 17.9 % — ABNORMAL HIGH (ref 11.5–15.5)

## 2012-01-05 LAB — POCT PREGNANCY, URINE: Preg Test, Ur: NEGATIVE

## 2012-01-05 MED ORDER — HYDROCOD POLST-CHLORPHEN POLST 10-8 MG/5ML PO LQCR: 5.0000 mL | Freq: Two times a day (BID) | ORAL | Status: DC | PRN

## 2012-01-05 MED ORDER — ONDANSETRON 4 MG PO TBDP
ORAL_TABLET | ORAL | Status: AC
  Filled 2012-01-05: qty 2

## 2012-01-05 MED ORDER — ONDANSETRON 4 MG PO TBDP
8.0000 mg | ORAL_TABLET | Freq: Once | ORAL | Status: AC
  Administered 2012-01-05: 8 mg via ORAL

## 2012-01-05 MED ORDER — ONDANSETRON 8 MG PO TBDP: 8.0000 mg | ORAL_TABLET | Freq: Three times a day (TID) | ORAL | Status: DC | PRN

## 2012-01-05 MED ORDER — SODIUM CHLORIDE 0.9 % IV BOLUS (SEPSIS)
1000.0000 mL | Freq: Once | INTRAVENOUS | Status: AC
  Administered 2012-01-05: 1000 mL via INTRAVENOUS

## 2012-01-05 MED ORDER — FERROUS SULFATE 325 (65 FE) MG PO TABS: 325.0000 mg | ORAL_TABLET | Freq: Every day | ORAL | Status: DC

## 2012-01-05 NOTE — ED Provider Notes (Signed)
History     CSN: [id number]  Arrival date & time 01/04/12  2327   First MD Initiated Contact with Patient 01/05/12 0409      Chief Complaint  Patient presents with  . Flu-like symptoms     (Consider location/radiation/quality/duration/timing/severity/associated sxs/prior treatment) HPI This is a 31 year old female with a four-day history of flulike symptoms by which she means body aches, chills, nausea, vomiting, cough and chest burning. She has not had diarrhea. She has had several members of her family is sick with similar symptoms. She describes her symptoms as moderate to severe. She has had a poor appetite and poor fluid intake. She feels lightheaded on standing and generally weak. She was given Zofran on arrival per protocol with significant improvement in her nausea. She has been taking over-the-counter cold medications without adequate relief.  Past Medical History  Diagnosis Date  . Rheumatoid arthritis     History reviewed. No pertinent past surgical history.  No family history on file.  History  Substance Use Topics  . Smoking status: Never Smoker   . Smokeless tobacco: Not on file  . Alcohol Use: No    OB History    Grav Para Term Preterm Abortions TAB SAB Ect Mult Living                  Review of Systems  All other systems reviewed and are negative.    Allergies  Percocet  Home Medications   Current Outpatient Rx  Name  Route  Sig  Dispense  Refill  . ACETAMINOPHEN 325 MG PO TABS   Oral   Take 650 mg by mouth every 6 (six) hours as needed. For pain or headache         . DEXTROMETHORPHAN POLISTIREX ER 30 MG/5ML PO LQCR   Oral   Take 60 mg by mouth 2 (two) times daily as needed. cough         . IBUPROFEN 200 MG PO TABS   Oral   Take 600 mg by mouth every 6 (six) hours as needed. Pain or headache         . THERAFLU COLD & COUGH PO   Oral   Take 1 Package by mouth every 6 (six) hours as needed. Cough and could symptoms            BP 105/60  Pulse 79  Temp 98.3 F (36.8 C)  Resp 16  SpO2 99%  LMP 12/15/2011  Physical Exam General: Well-developed, well-nourished female in no acute distress; appearance consistent with age of record HENT: normocephalic, atraumatic Eyes: pupils equal round and reactive to light; extraocular muscles intact Neck: supple Heart: regular rate and rhythm Lungs: clear to auscultation bilaterally Abdomen: soft; nondistended; nontender; bowel sounds present Extremities: No deformity; full range of motion Neurologic: Awake, alert and oriented; motor function intact in all extremities and symmetric; no facial droop Skin: Warm and dry     ED Course  Procedures (including critical care time)     MDM   Nursing notes and vitals signs, including pulse oximetry, reviewed.  Summary of this visit's results, reviewed by myself:  Labs:  Results for orders placed during the hospital encounter of 01/05/12 (from the past 24 hour(s))  CBC WITH DIFFERENTIAL     Status: Abnormal   Collection Time   01/04/12 11:38 PM      Component Value Range   WBC 3.3 (*) 4.0 - 10.5 K/uL   RBC 3.93  3.87 - 5.11 MIL/uL  Hemoglobin 8.1 (*) 12.0 - 15.0 g/dL   HCT 45.4 (*) 09.8 - 11.9 %   MCV 67.4 (*) 78.0 - 100.0 fL   MCH 20.6 (*) 26.0 - 34.0 pg   MCHC 30.6  30.0 - 36.0 g/dL   RDW 14.7 (*) 82.9 - 56.2 %   Platelets 341  150 - 400 K/uL   Neutrophils Relative 52  43 - 77 %   Lymphocytes Relative 36  12 - 46 %   Monocytes Relative 11  3 - 12 %   Eosinophils Relative 1  0 - 5 %   Basophils Relative 0  0 - 1 %   Neutro Abs 1.7  1.7 - 7.7 K/uL   Lymphs Abs 1.2  0.7 - 4.0 K/uL   Monocytes Absolute 0.4  0.1 - 1.0 K/uL   Eosinophils Absolute 0.0  0.0 - 0.7 K/uL   Basophils Absolute 0.0  0.0 - 0.1 K/uL   RBC Morphology ELLIPTOCYTES    COMPREHENSIVE METABOLIC PANEL     Status: Abnormal   Collection Time   01/04/12 11:38 PM      Component Value Range   Sodium 133 (*) 135 - 145 mEq/L   Potassium 4.0   3.5 - 5.1 mEq/L   Chloride 98  96 - 112 mEq/L   CO2 26  19 - 32 mEq/L   Glucose, Bld 132 (*) 70 - 99 mg/dL   BUN 8  6 - 23 mg/dL   Creatinine, Ser 1.30  0.50 - 1.10 mg/dL   Calcium 8.7  8.4 - 86.5 mg/dL   Total Protein 8.3  6.0 - 8.3 g/dL   Albumin 3.4 (*) 3.5 - 5.2 g/dL   AST 30  0 - 37 U/L   ALT 17  0 - 35 U/L   Alkaline Phosphatase 71  39 - 117 U/L   Total Bilirubin 0.1 (*) 0.3 - 1.2 mg/dL   GFR calc non Af Amer >90  >90 mL/min   GFR calc Af Amer >90  >90 mL/min  URINALYSIS, ROUTINE W REFLEX MICROSCOPIC     Status: Abnormal   Collection Time   01/05/12  2:14 AM      Component Value Range   Color, Urine YELLOW  YELLOW   APPearance CLOUDY (*) CLEAR   Specific Gravity, Urine 1.028  1.005 - 1.030   pH 6.0  5.0 - 8.0   Glucose, UA NEGATIVE  NEGATIVE mg/dL   Hgb urine dipstick NEGATIVE  NEGATIVE   Bilirubin Urine NEGATIVE  NEGATIVE   Ketones, ur 15 (*) NEGATIVE mg/dL   Protein, ur 30 (*) NEGATIVE mg/dL   Urobilinogen, UA 0.2  0.0 - 1.0 mg/dL   Nitrite NEGATIVE  NEGATIVE   Leukocytes, UA SMALL (*) NEGATIVE  URINE MICROSCOPIC-ADD ON     Status: Abnormal   Collection Time   01/05/12  2:14 AM      Component Value Range   Squamous Epithelial / LPF MANY (*) RARE   WBC, UA 3-6  <3 WBC/hpf   Bacteria, UA FEW (*) RARE   Casts GRANULAR CAST (*) NEGATIVE  POCT PREGNANCY, URINE     Status: Normal   Collection Time   01/05/12  2:23 AM      Component Value Range   Preg Test, Ur NEGATIVE  NEGATIVE    5:25 AM Patient no longer lightheaded on uprighting herself after IV fluid bolus. Nausea controlled by Zofran. Symptomatology consistent with viral syndrome, likely influenza as it is prevalent in the community  at this time. She was advised of her anemia and she will follow up with her primary physician regarding this. Will also refer her to gynecology, as she states she is very heavy periods.         Hanley Seamen, MD 01/05/12 [phone]

## 2012-01-05 NOTE — ED Notes (Addendum)
Pt c/o cough, nausea, body aches, fatigue and nasal congestion x4 days. Pt in NAD at this time.

## 2012-01-06 LAB — URINE CULTURE

## 2014-01-08 DIAGNOSIS — Z9289 Personal history of other medical treatment: Secondary | ICD-10-CM

## 2014-01-08 HISTORY — DX: Personal history of other medical treatment: Z92.89

## 2014-02-03 ENCOUNTER — Observation Stay (HOSPITAL_COMMUNITY)
Admission: EM | Admit: 2014-02-03 | Discharge: 2014-02-04 | Disposition: A | Discharge status: Home / Self Care | Payer: 59 | Attending: Internal Medicine | Admitting: Internal Medicine

## 2014-02-03 ENCOUNTER — Encounter (HOSPITAL_COMMUNITY): Payer: Self-pay

## 2014-02-03 DIAGNOSIS — Z885 Allergy status to narcotic agent status: Secondary | ICD-10-CM | POA: Diagnosis not present

## 2014-02-03 DIAGNOSIS — Z87891 Personal history of nicotine dependence: Secondary | ICD-10-CM | POA: Diagnosis not present

## 2014-02-03 DIAGNOSIS — R739 Hyperglycemia, unspecified: Secondary | ICD-10-CM | POA: Insufficient documentation

## 2014-02-03 DIAGNOSIS — D509 Iron deficiency anemia, unspecified: Secondary | ICD-10-CM | POA: Diagnosis not present

## 2014-02-03 DIAGNOSIS — R55 Syncope and collapse: Secondary | ICD-10-CM | POA: Insufficient documentation

## 2014-02-03 DIAGNOSIS — M069 Rheumatoid arthritis, unspecified: Secondary | ICD-10-CM | POA: Diagnosis not present

## 2014-02-03 DIAGNOSIS — D649 Anemia, unspecified: Secondary | ICD-10-CM

## 2014-02-03 DIAGNOSIS — N920 Excessive and frequent menstruation with regular cycle: Secondary | ICD-10-CM | POA: Insufficient documentation

## 2014-02-03 LAB — I-STAT CHEM 8, ED
BUN: 14 mg/dL (ref 6–23)
CHLORIDE: 103 mmol/L (ref 96–112)
CREATININE: 1 mg/dL (ref 0.50–1.10)
Calcium, Ion: 1.16 mmol/L (ref 1.12–1.23)
GLUCOSE: 103 mg/dL — AB (ref 70–99)
HCT: 28 % — ABNORMAL LOW (ref 36.0–46.0)
Hemoglobin: 9.5 g/dL — ABNORMAL LOW (ref 12.0–15.0)
Potassium: 3.7 mmol/L (ref 3.5–5.1)
SODIUM: 141 mmol/L (ref 135–145)
TCO2: 22 mmol/L (ref 0–100)

## 2014-02-03 LAB — CBC
HCT: 24.7 % — ABNORMAL LOW (ref 36.0–46.0)
HEMATOCRIT: 23.3 % — AB (ref 36.0–46.0)
HEMOGLOBIN: 7.4 g/dL — AB (ref 12.0–15.0)
Hemoglobin: 7.1 g/dL — ABNORMAL LOW (ref 12.0–15.0)
MCH: 19.3 pg — AB (ref 26.0–34.0)
MCH: 19.6 pg — AB (ref 26.0–34.0)
MCHC: 30 g/dL (ref 30.0–36.0)
MCHC: 30.5 g/dL (ref 30.0–36.0)
MCV: 64.5 fL — AB (ref 78.0–100.0)
MCV: 64.4 fL — AB (ref 78.0–100.0)
PLATELETS: 440 10*3/uL — AB (ref 150–400)
PLATELETS: 401 10*3/uL — AB (ref 150–400)
RBC: 3.83 MIL/uL — AB (ref 3.87–5.11)
RBC: 3.62 MIL/uL — ABNORMAL LOW (ref 3.87–5.11)
RDW: 19.7 % — ABNORMAL HIGH (ref 11.5–15.5)
RDW: 19.5 % — ABNORMAL HIGH (ref 11.5–15.5)
WBC: 6.5 10*3/uL (ref 4.0–10.5)
WBC: 7.3 10*3/uL (ref 4.0–10.5)

## 2014-02-03 LAB — PREPARE RBC (CROSSMATCH)

## 2014-02-03 LAB — BASIC METABOLIC PANEL
ANION GAP: 7 (ref 5–15)
BUN: 13 mg/dL (ref 6–23)
CO2: 26 mmol/L (ref 19–32)
Calcium: 8.9 mg/dL (ref 8.4–10.5)
Chloride: 105 mmol/L (ref 96–112)
Creatinine, Ser: 1.05 mg/dL (ref 0.50–1.10)
GFR calc Af Amer: 79 mL/min — ABNORMAL LOW (ref >90)
GFR calc non Af Amer: 68 mL/min — ABNORMAL LOW (ref >90)
Glucose, Bld: 99 mg/dL (ref 70–99)
POTASSIUM: 3.7 mmol/L (ref 3.5–5.1)
Sodium: 138 mmol/L (ref 135–145)

## 2014-02-03 LAB — POC URINE PREG, ED: Preg Test, Ur: NEGATIVE

## 2014-02-03 MED ORDER — SODIUM CHLORIDE 0.9 % IJ SOLN
3.0000 mL | Freq: Two times a day (BID) | INTRAMUSCULAR | Status: DC
  Administered 2014-02-03: 3 mL via INTRAVENOUS

## 2014-02-03 MED ORDER — ACETAMINOPHEN 325 MG PO TABS
650.0000 mg | ORAL_TABLET | Freq: Once | ORAL | Status: AC
  Administered 2014-02-03: 650 mg via ORAL
  Filled 2014-02-03: qty 2

## 2014-02-03 MED ORDER — SODIUM CHLORIDE 0.9 % IV BOLUS (SEPSIS)
1000.0000 mL | Freq: Once | INTRAVENOUS | Status: AC
  Administered 2014-02-03: 1000 mL via INTRAVENOUS

## 2014-02-03 MED ORDER — SODIUM CHLORIDE 0.9 % IV SOLN
Freq: Once | INTRAVENOUS | Status: AC
  Administered 2014-02-03: 23:00:00 via INTRAVENOUS

## 2014-02-03 MED ORDER — DIPHENHYDRAMINE HCL 50 MG/ML IJ SOLN
25.0000 mg | Freq: Once | INTRAMUSCULAR | Status: AC
  Administered 2014-02-03: 25 mg via INTRAVENOUS
  Filled 2014-02-03: qty 1

## 2014-02-03 MED ORDER — FERROUS SULFATE 325 (65 FE) MG PO TABS
325.0000 mg | ORAL_TABLET | Freq: Every day | ORAL | Status: DC
  Administered 2014-02-03 – 2014-02-04 (×2): 325 mg via ORAL
  Filled 2014-02-03 (×2): qty 1

## 2014-02-03 MED ORDER — DIPHENHYDRAMINE HCL 25 MG PO CAPS
25.0000 mg | ORAL_CAPSULE | Freq: Once | ORAL | Status: AC
  Administered 2014-02-03: 25 mg via ORAL
  Filled 2014-02-03: qty 1

## 2014-02-03 MED ORDER — METOCLOPRAMIDE HCL 5 MG/ML IJ SOLN
10.0000 mg | Freq: Once | INTRAMUSCULAR | Status: AC
  Administered 2014-02-03: 10 mg via INTRAVENOUS
  Filled 2014-02-03: qty 2

## 2014-02-03 MED ORDER — ACETAMINOPHEN 325 MG PO TABS: 650.0000 mg | ORAL_TABLET | Freq: Four times a day (QID) | ORAL | Status: DC | PRN

## 2014-02-03 MED ORDER — ACETAMINOPHEN 650 MG RE SUPP: 650.0000 mg | Freq: Four times a day (QID) | RECTAL | Status: DC | PRN

## 2014-02-03 NOTE — H&P (Signed)
Tamara Green is an 34 y.o. female.    Pcp: unassigned [facility] Latino No rheumatologist  Chief Complaint:   anemia HPI: 34 yo female with hx of anemia, apparently c/o heavy menses and then was at work today, trying to get something off the shelf and felt lightheaded.  Pt then went to take off sweater and then almost fell cause felt dizzy.  Pt stood still and then went and sat down and then was noted to have slightly high heart rate at work and then presented to ED for evaluation.  Pt was noted to have anemia with hgb 7.4  Pt will be admitted for symptomatic anemia.   Past Medical History  Diagnosis Date  . Rheumatoid arthritis(714.0)   . Anemia     Past Surgical History  Procedure Laterality Date  . Breast biopsy      Family History  Problem Relation Age of Onset  . Diabetes Mother    Social History:  reports that she quit smoking about 2 years ago. Her smoking use included Cigarettes. She has a 3 pack-year smoking history. She does not have any smokeless tobacco history on file. She reports that she does not drink alcohol or use illicit drugs.  Allergies:  Allergies  Allergen Reactions  . Percocet [Oxycodone-Acetaminophen] Hives     (Not in a hospital admission)  Results for orders placed or performed during the hospital encounter of 02/03/14 (from the past 48 hour(s))  CBC  (at [facility])     Status: Abnormal   Collection Time: 02/03/14  1:10 PM  Result Value Ref Range   WBC 6.5 4.0 - 10.5 K/uL   RBC 3.83 (L) 3.87 - 5.11 MIL/uL   Hemoglobin 7.4 (L) 12.0 - 15.0 g/dL   HCT 24.7 (L) 36.0 - 46.0 %   MCV 64.5 (L) 78.0 - 100.0 fL   MCH 19.3 (L) 26.0 - 34.0 pg   MCHC 30.0 30.0 - 36.0 g/dL   RDW 19.7 (H) 11.5 - 15.5 %   Platelets 440 (H) 150 - 400 K/uL  Basic metabolic panel  (at [facility])     Status: Abnormal   Collection Time: 02/03/14  1:10 PM  Result Value Ref Range   Sodium 138 135 - 145 mmol/L   Potassium 3.7 3.5 - 5.1 mmol/L    Chloride 105 96 - 112 mmol/L   CO2 26 19 - 32 mmol/L   Glucose, Bld 99 70 - 99 mg/dL   BUN 13 6 - 23 mg/dL   Creatinine, Ser 1.05 0.50 - 1.10 mg/dL   Calcium 8.9 8.4 - 10.5 mg/dL   GFR calc non Af Amer 68 (L) >90 mL/min   GFR calc Af Amer 79 (L) >90 mL/min    Comment: (NOTE) The eGFR has been calculated using the CKD EPI equation. This calculation has not been validated in all clinical situations. eGFR's persistently <90 mL/min signify possible Chronic Kidney Disease.    Anion gap 7 5 - 15  I-Stat Chem 8, ED     Status: Abnormal   Collection Time: 02/03/14  1:34 PM  Result Value Ref Range   Sodium 141 135 - 145 mmol/L   Potassium 3.7 3.5 - 5.1 mmol/L   Chloride 103 96 - 112 mmol/L   BUN 14 6 - 23 mg/dL   Creatinine, Ser 1.00 0.50 - 1.10 mg/dL   Glucose, Bld 103 (H) 70 - 99 mg/dL   Calcium, Ion 1.16 1.12 - 1.23 mmol/L  TCO2 22 0 - 100 mmol/L   Hemoglobin 9.5 (L) 12.0 - 15.0 g/dL   HCT 28.0 (L) 36.0 - 46.0 %  POC Urine Preg, ED (if patient is pre-menopausal female) NOT at [facility]     Status: None   Collection Time: 02/03/14  4:46 PM  Result Value Ref Range   Preg Test, Ur NEGATIVE NEGATIVE    Comment:        THE SENSITIVITY OF THIS METHODOLOGY IS >24 mIU/mL   CBC     Status: Abnormal   Collection Time: 02/03/14  6:15 PM  Result Value Ref Range   WBC 7.3 4.0 - 10.5 K/uL   RBC 3.62 (L) 3.87 - 5.11 MIL/uL   Hemoglobin 7.1 (L) 12.0 - 15.0 g/dL    Comment: REPEATED TO VERIFY DELTA CHECK NOTED    HCT 23.3 (L) 36.0 - 46.0 %   MCV 64.4 (L) 78.0 - 100.0 fL   MCH 19.6 (L) 26.0 - 34.0 pg   MCHC 30.5 30.0 - 36.0 g/dL   RDW 19.5 (H) 11.5 - 15.5 %   Platelets 401 (H) 150 - 400 K/uL   No results found.  Review of Systems  Constitutional: Positive for malaise/fatigue. Negative for fever, chills, weight loss and diaphoresis.  HENT: Negative for congestion, ear discharge, ear pain, hearing loss, nosebleeds, sore throat and tinnitus.   Eyes: Negative for blurred vision, double  vision, photophobia, pain, discharge and redness.  Respiratory: Negative for cough, hemoptysis, sputum production, shortness of breath, wheezing and stridor.   Cardiovascular: Negative for chest pain, palpitations, orthopnea, claudication, leg swelling and PND.  Gastrointestinal: Negative for heartburn, nausea, vomiting, abdominal pain, diarrhea, constipation, blood in stool and melena.  Genitourinary: Negative for dysuria, urgency, frequency, hematuria and flank pain.  Musculoskeletal: Positive for joint pain. Negative for myalgias, back pain, falls and neck pain.  Skin: Negative for itching and rash.  Neurological: Positive for dizziness. Negative for tingling, tremors, sensory change, speech change, focal weakness, seizures, loss of consciousness, weakness and headaches.  Endo/Heme/Allergies: Negative for environmental allergies and polydipsia. Does not bruise/bleed easily.  Psychiatric/Behavioral: Negative for depression, suicidal ideas, hallucinations, memory loss and substance abuse. The patient is not nervous/anxious and does not have insomnia.     Blood pressure 122/71, pulse 68, temperature 98.7 F (37.1 C), temperature source Oral, resp. rate 15, last menstrual period 01/27/2014, SpO2 100 %. Physical Exam  Constitutional: She is oriented to person, place, and time. She appears well-developed and well-nourished.  HENT:  Head: Normocephalic and atraumatic.  Eyes: Conjunctivae and EOM are normal. Pupils are equal, round, and reactive to light. No scleral icterus.  Pale conjunctiva  Neck: Normal range of motion. Neck supple. No JVD present. No tracheal deviation present. No thyromegaly present.  Cardiovascular: Normal rate and regular rhythm.  Exam reveals no gallop and no friction rub.   No murmur heard. Respiratory: Effort normal and breath sounds normal.  GI: Soft. Bowel sounds are normal. She exhibits no distension. There is no tenderness. There is no rebound and no guarding.   Musculoskeletal: Normal range of motion. She exhibits no edema or tenderness.  Lymphadenopathy:    She has no cervical adenopathy.  Neurological: She is alert and oriented to person, place, and time. She has normal reflexes. She displays normal reflexes. No cranial nerve deficit. She exhibits normal muscle tone. Coordination normal.  Skin: Skin is warm and dry. No rash noted. No erythema. No pallor.  Psychiatric: She has a normal mood and affect. Her behavior  is normal. Judgment and thought content normal.     Assessment/Plan Anemia ? Secondary to Menorrhagia Check iron studies, b12, folate, tsh, spep, upep, esr, pt, ptt hemeoccult stool Transfuse with 2 units prbc  Near syncope likely secondary to anemia Check trop i q6hx 2 Tele  Hyperglycemia Check hga1c   Jani Gravel 02/03/2014, 7:33 PM

## 2014-02-03 NOTE — ED Provider Notes (Signed)
CSN: [id number]     Arrival date & time 02/03/14  1256 History   First MD Initiated Contact with Patient 02/03/14 1536     Chief Complaint  Patient presents with  . Dizziness  . Weakness     (Consider location/radiation/quality/duration/timing/severity/associated sxs/prior Treatment) HPI  Pt presenting with c/o lightheadedness- she was at work and nearly fainted.  She states she had just stood up from getting some boxes from the floor and felt very lightheaded and woozy.  She states she had to lean over and hold onto the counter to keep from falling.  Per EMS patient was diaphoretic and heart racing when they arrived.  No chest pain.  No difficulty breathing.  States she has hx of anemia, takes iron supplements.  States she does have heavy periods- her menses stopped yesterday- today only spotting.  No fever/chills.  No recent vomiting or diarrhea.  Pt denies blood in stool or melena.  There are no other associated systemic symptoms, there are no other alleviating or modifying factors.   Past Medical History  Diagnosis Date  . Rheumatoid arthritis(714.0)   . Anemia    Past Surgical History  Procedure Laterality Date  . Breast biopsy     Family History  Problem Relation Age of Onset  . Diabetes Mother    History  Substance Use Topics  . Smoking status: Former Smoker -- 0.50 packs/day for 6 years    Types: Cigarettes    Quit date: 01/09/2012  . Smokeless tobacco: Not on file  . Alcohol Use: No   OB History    No data available     Review of Systems  ROS reviewed and all otherwise negative except for mentioned in HPI    Allergies  Percocet  Home Medications   Prior to Admission medications   Medication Sig Start Date End Date Taking? Authorizing Provider  acetaminophen (TYLENOL) 325 MG tablet Take 650 mg by mouth every 6 (six) hours as needed. For pain or headache   Yes Historical Provider, MD  meloxicam (MOBIC) 15 MG tablet Take 15 mg by mouth daily.   Yes Historical  Provider, MD  chlorpheniramine-HYDROcodone (TUSSIONEX PENNKINETIC ER) 10-8 MG/5ML LQCR Take 5 mLs by mouth every 12 (twelve) hours as needed (for cough). 01/05/12   Karen Chafe Molpus, MD  dextromethorphan (DELSYM) 30 MG/5ML liquid Take 60 mg by mouth 2 (two) times daily as needed. cough    Historical Provider, MD  ferrous sulfate 325 (65 FE) MG tablet Take 1 tablet (325 mg total) by mouth daily. 01/05/12   John L Molpus, MD  ibuprofen (ADVIL,MOTRIN) 200 MG tablet Take 600 mg by mouth every 6 (six) hours as needed. Pain or headache    Historical Provider, MD  ondansetron (ZOFRAN ODT) 8 MG disintegrating tablet Take 1 tablet (8 mg total) by mouth every 8 (eight) hours as needed for nausea. 01/05/12   John L Molpus, MD  Phenylephrine-Pheniramine-DM [facility] COLD & COUGH PO) Take 1 Package by mouth every 6 (six) hours as needed. Cough and could symptoms    Historical Provider, MD   BP 124/67 mmHg  Pulse 70  Temp(Src) 98.7 F (37.1 C) (Oral)  Resp 18  Ht 5\' 7"  (1.702 m)  Wt 262 lb 12.6 oz (119.2 kg)  BMI 41.15 kg/m2  SpO2 100%  LMP 01/27/2014  Vitals reviewed Physical Exam  Physical Examination: General appearance - alert, well appearing, and in no distress Mental status - alert, oriented to person, place, and time Eyes -  conjunctival pallor, no scleral icterus, no conjunctival injection Mouth - mucous membranes moist, pharynx normal without lesions Chest - clear to auscultation, no wheezes, rales or rhonchi, symmetric air entry Heart - normal rate, regular rhythm, normal S1, S2, no murmurs, rubs, clicks or gallops Abdomen - soft, nontender, nondistended, no masses or organomegaly Extremities - peripheral pulses normal, no pedal edema, no clubbing or cyanosis Skin - normal coloration and turgor, no rashes  ED Course  Procedures (including critical care time)  7:07 PM d/w Dr. Maudie Mercury, [facility] for admission pt to go telemetry bed, observation.   Labs Review Labs Reviewed  CBC - Abnormal; Notable  for the following:    RBC 3.83 (*)    Hemoglobin 7.4 (*)    HCT 24.7 (*)    MCV 64.5 (*)    MCH 19.3 (*)    RDW 19.7 (*)    Platelets 440 (*)    All other components within normal limits  BASIC METABOLIC PANEL - Abnormal; Notable for the following:    GFR calc non Af Amer 68 (*)    GFR calc Af Amer 79 (*)    All other components within normal limits  CBC - Abnormal; Notable for the following:    RBC 3.62 (*)    Hemoglobin 7.1 (*)    HCT 23.3 (*)    MCV 64.4 (*)    MCH 19.6 (*)    RDW 19.5 (*)    Platelets 401 (*)    All other components within normal limits  I-STAT CHEM 8, ED - Abnormal; Notable for the following:    Glucose, Bld 103 (*)    Hemoglobin 9.5 (*)    HCT 28.0 (*)    All other components within normal limits  CBC  HEMOGLOBIN A1C  FERRITIN  VITAMIN B12  FOLATE RBC  IRON AND TIBC  PROTEIN ELECTROPHORESIS, SERUM  IMMUNOFIXATION ELECTROPHORESIS, URINE (WITH TOT PROT)  CBG MONITORING, ED  POC URINE PREG, ED  PREPARE RBC (CROSSMATCH)  TYPE AND SCREEN    Imaging Review No results found.   EKG Interpretation   Date/Time:  Wednesday February 03 2014 13:06:25 EST Ventricular Rate:  82 PR Interval:  162 QRS Duration: 86 QT Interval:  372 QTC Calculation: 434 R Axis:   79 Text Interpretation:  Normal sinus rhythm Nonspecific T wave abnormality  Abnormal ECG Since previous tracing t wave abnormalities are new Confirmed  by Canary Brim  MD, MARTHA [phone]) on 02/03/2014 10:49:32 PM      MDM   Final diagnoses:  Symptomatic anemia  Near syncope    Pt presenting with c/o near syncope- found to have anemia worse than her baseline.  Pt given IV fluids.  She has hx of menorrhagia and takes iron supplements.  D/w [facility] for admission for either transfusion or serial hemoglobins.      Threasa Beards, MD 02/03/14 2251

## 2014-02-03 NOTE — ED Notes (Signed)
Pt from work with dizziness.  Stated she was walking and felt like she was going to fall. Pt denies LOC.  Per pt, first responder at work told her her heart was racing, she was clammy and had dilated pupils.  Pt denying any pain in triage.  CAO x 4.

## 2014-02-03 NOTE — ED Notes (Signed)
Dr. Kim at bedside   

## 2014-02-04 DIAGNOSIS — D500 Iron deficiency anemia secondary to blood loss (chronic): Secondary | ICD-10-CM

## 2014-02-04 LAB — IRON AND TIBC
IRON: 10 ug/dL — AB (ref 42–145)
Saturation Ratios: 2 % — ABNORMAL LOW (ref 20–55)
TIBC: 421 ug/dL (ref 250–470)
UIBC: 411 ug/dL — AB (ref 125–400)

## 2014-02-04 LAB — CBC
HEMATOCRIT: 28.8 % — AB (ref 36.0–46.0)
Hemoglobin: 8.9 g/dL — ABNORMAL LOW (ref 12.0–15.0)
MCH: 21.2 pg — ABNORMAL LOW (ref 26.0–34.0)
MCHC: 30.9 g/dL (ref 30.0–36.0)
MCV: 68.6 fL — ABNORMAL LOW (ref 78.0–100.0)
Platelets: 389 10*3/uL (ref 150–400)
RBC: 4.2 MIL/uL (ref 3.87–5.11)
RDW: 21.1 % — ABNORMAL HIGH (ref 11.5–15.5)
WBC: 6.7 10*3/uL (ref 4.0–10.5)

## 2014-02-04 LAB — TROPONIN I: Troponin I: <0.03 ng/mL (ref <0.031)

## 2014-02-04 LAB — FERRITIN: Ferritin: 5 ng/mL — ABNORMAL LOW (ref 10–291)

## 2014-02-04 LAB — VITAMIN B12: Vitamin B-12: 530 pg/mL (ref 211–911)

## 2014-02-04 LAB — ABO/RH: ABO/RH(D): O NEG

## 2014-02-04 MED ORDER — FERROUS SULFATE 325 (65 FE) MG PO TABS: 325.0000 mg | ORAL_TABLET | Freq: Three times a day (TID) | ORAL | Status: DC

## 2014-02-04 NOTE — Progress Notes (Signed)
D/c instructions reviewed with pt and her husband. Copy of instructions given to pt. Pt needing note for return to work. Dr Candiss Norse contacted and will provide note for work.

## 2014-02-04 NOTE — Progress Notes (Signed)
     Tamara Green was admitted to the Hospital on 02/03/2014 and Discharged  02/04/2014 and should be excused from work/school   for 6 days starting 02/03/2014 , may return to work/school without any restrictions.  Call Lala Lund MD, [facility] [phone] with questions.  Thurnell Lose M.D on 02/04/2014,at 12:01 PM  [facility]   Office  [phone]

## 2014-02-04 NOTE — Progress Notes (Signed)
Note provided to pt.

## 2014-02-04 NOTE — Progress Notes (Signed)
NT checked pt's orthostatic vitals, pt had no dizziness or lightheadedness when changing positions, nor any when walking to bathroom.( see grapics for vitals) Pt walked in hallway with husband, pt states she did get lightheaded while walking in hall, pt returned to room and rested in bed.

## 2014-02-04 NOTE — Discharge Instructions (Signed)
Follow with Primary MD and your OB MD in 7 days   Get CBC, CMP, 2 view Chest X ray checked  by Primary MD next visit.    Activity: As tolerated with Full fall precautions use walker/cane & assistance as needed   Disposition Home     Diet: Heart Healthy   For Heart failure patients - Check your Weight same time everyday, if you gain over 2 pounds, or you develop in leg swelling, experience more shortness of breath or chest pain, call your Primary MD immediately. Follow Cardiac Low Salt Diet and 1.8 lit/day fluid restriction.   On your next visit with your primary care physician please Get Medicines reviewed and adjusted.   Please request your Prim.MD to go over all Hospital Tests and Procedure/Radiological results at the follow up, please get all Hospital records sent to your Prim MD by signing hospital release before you go home.   If you experience worsening of your admission symptoms, develop shortness of breath, life threatening emergency, suicidal or homicidal thoughts you must seek medical attention immediately by calling 911 or calling your MD immediately  if symptoms less severe.  You Must read complete instructions/literature along with all the possible adverse reactions/side effects for all the Medicines you take and that have been prescribed to you. Take any new Medicines after you have completely understood and accpet all the possible adverse reactions/side effects.   Do not drive, operating heavy machinery, perform activities at heights, swimming or participation in water activities or provide baby sitting services if your were admitted for syncope or siezures until you have seen by Primary MD or a Neurologist and advised to do so again.  Do not drive when taking Pain medications.    Do not take more than prescribed Pain, Sleep and Anxiety Medications  Special Instructions: If you have smoked or chewed Tobacco  in the last 2 yrs please stop smoking, stop any regular  Alcohol  and or any Recreational drug use.  Wear Seat belts while driving.   Please note  You were cared for by a hospitalist during your hospital stay. If you have any questions about your discharge medications or the care you received while you were in the hospital after you are discharged, you can call the unit and asked to speak with the hospitalist on call if the hospitalist that took care of you is not available. Once you are discharged, your primary care physician will handle any further medical issues. Please note that NO REFILLS for any discharge medications will be authorized once you are discharged, as it is imperative that you return to your primary care physician (or establish a relationship with a primary care physician if you do not have one) for your aftercare needs so that they can reassess your need for medications and monitor your lab values.

## 2014-02-04 NOTE — Discharge Summary (Signed)
Tamara Green, is a 34 y.o. female  DOB Oct 11, 1980  MRN [id number].  Admission date:  02/03/2014  Admitting Physician  Jani Gravel, MD  Discharge Date:  02/04/2014   Primary MD  No primary care provider on file.  Recommendations for primary care physician for things to follow:   On a CBC and iron panel closely. One-time outpatient OB follow-up for severe menorrhagia.   Admission Diagnosis  .Symptomatic anemia [5621308]    Discharge Diagnosis  .Symptomatic anemia [6578469]     Active Problems:   Symptomatic anemia   Rheumatoid arthritis   Anemia      Past Medical History  Diagnosis Date  . Rheumatoid arthritis(714.0)   . Anemia     Past Surgical History  Procedure Laterality Date  . Breast biopsy         History of present illness and  Hospital Course:     Kindly see H&P for history of present illness and admission details, please review complete Labs, Consult reports and Test reports for all details in brief  HPI  from the history and physical done on the day of admission  34 yo female with hx of anemia, apparently c/o heavy menses and then was at work today, trying to get something off the shelf and felt lightheaded. Pt then went to take off sweater and then almost fell cause felt dizzy. Pt stood still and then went and sat down and then was noted to have slightly high heart rate at work and then presented to ED for evaluation. Pt was noted to have anemia with hgb 7.4 Pt will be admitted for symptomatic anemia.   Hospital Course    1. Symptomatic iron deficiency anemia due to severe menorrhagia - 2 units packed RBC transfusion with good effect, placed on oral iron 3 times a day, requested to follow with PCP to get CBC and iron levels monitored closely, also requested to follow with an OB physician 1  time. She also takes NSAIDs for arthritis which I have requested her to stop and take Tylenol as before. No history see digestive of GI bleed. Currently completely symptom free admitted in the hallway, EKG nonacute troponin negative.    Discharge Condition: stable   Follow UP  Follow-up Information    Follow up with Melfa    . Schedule an appointment as soon as possible for a visit in 1 week.   Contact information:   [address] [place]  [id number] [phone]      Follow up with [facility], MD. Schedule an appointment as soon as possible for a visit in 1 week.   Specialty:  Obstetrics and Gynecology   Contact information:   [place] [address] [phone]         Discharge Instructions  and  Discharge Medications      Discharge Instructions    Diet - low sodium heart healthy    Complete by:  As directed  Discharge instructions    Complete by:  As directed   Follow with Primary MD and an OB MD in 7 days   Get CBC, CMP, 2 view Chest X ray checked  by Primary MD next visit.    Activity: As tolerated with Full fall precautions use walker/cane & assistance as needed   Disposition Home **   Diet: Heart Healthy ** , with feeding assistance and aspiration precautions as needed.  For Heart failure patients - Check your Weight same time everyday, if you gain over 2 pounds, or you develop in leg swelling, experience more shortness of breath or chest pain, call your Primary MD immediately. Follow Cardiac Low Salt Diet and 1.8 lit/day fluid restriction.   On your next visit with your primary care physician please Get Medicines reviewed and adjusted.   Please request your Prim.MD to go over all Hospital Tests and Procedure/Radiological results at the follow up, please get all Hospital records sent to your Prim MD by signing hospital release before you go home.   If you experience  worsening of your admission symptoms, develop shortness of breath, life threatening emergency, suicidal or homicidal thoughts you must seek medical attention immediately by calling 911 or calling your MD immediately  if symptoms less severe.  You Must read complete instructions/literature along with all the possible adverse reactions/side effects for all the Medicines you take and that have been prescribed to you. Take any new Medicines after you have completely understood and accpet all the possible adverse reactions/side effects.   Do not drive, operating heavy machinery, perform activities at heights, swimming or participation in water activities or provide baby sitting services if your were admitted for syncope or siezures until you have seen by Primary MD or a Neurologist and advised to do so again.  Do not drive when taking Pain medications.    Do not take more than prescribed Pain, Sleep and Anxiety Medications  Special Instructions: If you have smoked or chewed Tobacco  in the last 2 yrs please stop smoking, stop any regular Alcohol  and or any Recreational drug use.  Wear Seat belts while driving.   Please note  You were cared for by a hospitalist during your hospital stay. If you have any questions about your discharge medications or the care you received while you were in the hospital after you are discharged, you can call the unit and asked to speak with the hospitalist on call if the hospitalist that took care of you is not available. Once you are discharged, your primary care physician will handle any further medical issues. Please note that NO REFILLS for any discharge medications will be authorized once you are discharged, as it is imperative that you return to your primary care physician (or establish a relationship with a primary care physician if you do not have one) for your aftercare needs so that they can reassess your need for medications and monitor your lab values.      Increase activity slowly    Complete by:  As directed             Medication List    STOP taking these medications        chlorpheniramine-HYDROcodone 10-8 MG/5ML Lqcr  Commonly known as:  TUSSIONEX PENNKINETIC ER     dextromethorphan 30 MG/5ML liquid  Commonly known as:  DELSYM     ibuprofen 200 MG tablet  Commonly known as:  ADVIL,MOTRIN     meloxicam 15 MG tablet  Commonly known as:  MOBIC     THERAFLU COLD & COUGH PO      TAKE these medications        acetaminophen 325 MG tablet  Commonly known as:  TYLENOL  Take 650 mg by mouth every 6 (six) hours as needed. For pain or headache     ferrous sulfate 325 (65 FE) MG tablet  Take 1 tablet (325 mg total) by mouth 3 (three) times daily with meals.     ondansetron 8 MG disintegrating tablet  Commonly known as:  ZOFRAN ODT  Take 1 tablet (8 mg total) by mouth every 8 (eight) hours as needed for nausea.          Diet and Activity recommendation: See Discharge Instructions above   Consults obtained -  none   Major procedures and Radiology Reports - PLEASE review detailed and final reports for all details, in brief -       No results found.  Micro Results      No results found for this or any previous visit (from the past 240 hour(s)).     Today   Subjective:   Tamara Green today has no headache,no chest abdominal pain,no new weakness tingling or numbness, feels much better wants to go home today.    Objective:   Blood pressure 121/74, pulse 65, temperature 98.2 F (36.8 C), temperature source Oral, resp. rate 16, height 5\' 7"  (1.702 m), weight 118.8 kg (261 lb 14.5 oz), last menstrual period 01/27/2014, SpO2 100 %.   Intake/Output Summary (Last 24 hours) at 02/04/14 0954 Last data filed at 02/04/14 0521  Gross per 24 hour  Intake   1735 ml  Output    100 ml  Net   1635 ml    Exam Awake Alert, Oriented x 3, No new F.N deficits, Normal affect [place].AT,PERRAL Supple Neck,No JVD,  No cervical lymphadenopathy appriciated.  Symmetrical Chest wall movement, Good air movement bilaterally, CTAB RRR,No Gallops,Rubs or new Murmurs, No Parasternal Heave +ve B.Sounds, Abd Soft, Non tender, No organomegaly appriciated, No rebound -guarding or rigidity. No Cyanosis, Clubbing or edema, No new Rash or bruise  Data Review   CBC w Diff: Lab Results  Component Value Date   WBC 6.7 02/04/2014   HGB 8.9* 02/04/2014   HCT 28.8* 02/04/2014   PLT 389 02/04/2014   LYMPHOPCT 36 01/04/2012   MONOPCT 11 01/04/2012   EOSPCT 1 01/04/2012   BASOPCT 0 01/04/2012    CMP: Lab Results  Component Value Date   NA 141 02/03/2014   K 3.7 02/03/2014   CL 103 02/03/2014   CO2 26 02/03/2014   BUN 14 02/03/2014   CREATININE 1.00 02/03/2014   PROT 8.3 01/04/2012   ALBUMIN 3.4* 01/04/2012   BILITOT 0.1* 01/04/2012   ALKPHOS 71 01/04/2012   AST 30 01/04/2012   ALT 17 01/04/2012  .   Total Time in preparing paper work, data evaluation and todays exam - 35 minutes  Thurnell Lose M.D on 02/04/2014 at 9:54 AM  [facility] Office  [phone]

## 2014-02-04 NOTE — Progress Notes (Signed)
Pt d/c'd via wheelchair with belongings with husband, escorted by hospital volunteer.

## 2014-02-05 LAB — TYPE AND SCREEN
ABO/RH(D): O NEG
ANTIBODY SCREEN: NEGATIVE
UNIT DIVISION: 0
Unit division: 0

## 2014-02-05 LAB — UIFE/LIGHT CHAINS/TP QN, 24-HR UR
Albumin, U: DETECTED
Alpha 1, Urine: DETECTED — AB
Alpha 2, Urine: DETECTED — AB
BETA UR: DETECTED — AB
GAMMA UR: DETECTED — AB
Total Protein, Urine: 26 mg/dL — ABNORMAL HIGH (ref 5–24)

## 2014-02-05 LAB — PROTEIN ELECTROPHORESIS, SERUM
ALPHA-2-GLOBULIN: 8.3 % (ref 7.1–11.8)
Albumin ELP: 47.1 % — ABNORMAL LOW (ref 55.8–66.1)
Alpha-1-Globulin: 4.3 % (ref 2.9–4.9)
BETA 2: 5.6 % (ref 3.2–6.5)
Beta Globulin: 6.4 % (ref 4.7–7.2)
GAMMA GLOBULIN: 28.3 % — AB (ref 11.1–18.8)
M-Spike, %: NOT DETECTED g/dL
TOTAL PROTEIN ELP: 7.3 g/dL (ref 6.0–8.3)

## 2014-02-05 LAB — FOLATE RBC
FOLATE, HEMOLYSATE: 435.8 ng/mL
FOLATE, RBC: 1895 ng/mL (ref >498)
Hematocrit: 23 % — ABNORMAL LOW (ref 34.0–46.6)

## 2014-02-05 LAB — HEMOGLOBIN A1C
Hgb A1c MFr Bld: 5.8 % — ABNORMAL HIGH (ref 4.8–5.6)
Mean Plasma Glucose: 120 mg/dL

## 2014-02-11 ENCOUNTER — Encounter: Payer: 59 | Admitting: Obstetrics & Gynecology

## 2014-03-02 ENCOUNTER — Other Ambulatory Visit: Payer: Self-pay | Admitting: Family Medicine

## 2014-03-02 DIAGNOSIS — N6310 Unspecified lump in the right breast, unspecified quadrant: Principal | ICD-10-CM

## 2014-03-02 DIAGNOSIS — N6315 Unspecified lump in the right breast, overlapping quadrants: Secondary | ICD-10-CM

## 2014-03-09 ENCOUNTER — Ambulatory Visit
Admission: RE | Admit: 2014-03-09 | Discharge: 2014-03-09 | Disposition: A | Discharge status: Home / Self Care | Payer: 59 | Source: Ambulatory Visit | Attending: Family Medicine | Admitting: Family Medicine

## 2014-03-09 ENCOUNTER — Other Ambulatory Visit: Payer: Self-pay | Admitting: Family Medicine

## 2014-03-09 DIAGNOSIS — N6310 Unspecified lump in the right breast, unspecified quadrant: Principal | ICD-10-CM

## 2014-03-09 DIAGNOSIS — N6315 Unspecified lump in the right breast, overlapping quadrants: Secondary | ICD-10-CM

## 2014-08-23 ENCOUNTER — Encounter (HOSPITAL_COMMUNITY): Payer: Self-pay

## 2014-08-23 ENCOUNTER — Inpatient Hospital Stay (HOSPITAL_COMMUNITY)
Admission: AD | Admit: 2014-08-23 | Discharge: 2014-08-23 | Disposition: A | Discharge status: Home / Self Care | Payer: Medicaid Other | Source: Ambulatory Visit | Attending: Family Medicine | Admitting: Family Medicine

## 2014-08-23 ENCOUNTER — Inpatient Hospital Stay (HOSPITAL_COMMUNITY): Payer: Medicaid Other

## 2014-08-23 DIAGNOSIS — O3431 Maternal care for cervical incompetence, first trimester: Secondary | ICD-10-CM | POA: Insufficient documentation

## 2014-08-23 DIAGNOSIS — N949 Unspecified condition associated with female genital organs and menstrual cycle: Secondary | ICD-10-CM

## 2014-08-23 DIAGNOSIS — O26891 Other specified pregnancy related conditions, first trimester: Secondary | ICD-10-CM | POA: Insufficient documentation

## 2014-08-23 DIAGNOSIS — Z3A10 10 weeks gestation of pregnancy: Secondary | ICD-10-CM | POA: Insufficient documentation

## 2014-08-23 DIAGNOSIS — R109 Unspecified abdominal pain: Secondary | ICD-10-CM | POA: Diagnosis not present

## 2014-08-23 DIAGNOSIS — O3411 Maternal care for benign tumor of corpus uteri, first trimester: Secondary | ICD-10-CM | POA: Insufficient documentation

## 2014-08-23 DIAGNOSIS — D259 Leiomyoma of uterus, unspecified: Secondary | ICD-10-CM | POA: Insufficient documentation

## 2014-08-23 DIAGNOSIS — O99011 Anemia complicating pregnancy, first trimester: Secondary | ICD-10-CM

## 2014-08-23 DIAGNOSIS — O209 Hemorrhage in early pregnancy, unspecified: Secondary | ICD-10-CM

## 2014-08-23 DIAGNOSIS — R1030 Lower abdominal pain, unspecified: Secondary | ICD-10-CM | POA: Diagnosis present

## 2014-08-23 DIAGNOSIS — Z87891 Personal history of nicotine dependence: Secondary | ICD-10-CM | POA: Insufficient documentation

## 2014-08-23 DIAGNOSIS — R102 Pelvic and perineal pain: Secondary | ICD-10-CM

## 2014-08-23 LAB — CBC
HCT: 29.7 % — ABNORMAL LOW (ref 36.0–46.0)
HEMOGLOBIN: 9.9 g/dL — AB (ref 12.0–15.0)
MCH: 26 pg (ref 26.0–34.0)
MCHC: 33.3 g/dL (ref 30.0–36.0)
MCV: 78 fL (ref 78.0–100.0)
PLATELETS: 287 10*3/uL (ref 150–400)
RBC: 3.81 MIL/uL — AB (ref 3.87–5.11)
RDW: 19.2 % — ABNORMAL HIGH (ref 11.5–15.5)
WBC: 7.9 10*3/uL (ref 4.0–10.5)

## 2014-08-23 LAB — URINALYSIS, ROUTINE W REFLEX MICROSCOPIC
Bilirubin Urine: NEGATIVE
Glucose, UA: NEGATIVE mg/dL
Hgb urine dipstick: NEGATIVE
Ketones, ur: NEGATIVE mg/dL
LEUKOCYTES UA: NEGATIVE
NITRITE: NEGATIVE
PH: 7 (ref 5.0–8.0)
Protein, ur: 30 mg/dL — AB
SPECIFIC GRAVITY, URINE: 1.02 (ref 1.005–1.030)
UROBILINOGEN UA: 0.2 mg/dL (ref 0.0–1.0)

## 2014-08-23 LAB — WET PREP, GENITAL
CLUE CELLS WET PREP: NONE SEEN
TRICH WET PREP: NONE SEEN
Yeast Wet Prep HPF POC: NONE SEEN

## 2014-08-23 LAB — URINE MICROSCOPIC-ADD ON

## 2014-08-23 LAB — POCT PREGNANCY, URINE: PREG TEST UR: POSITIVE — AB

## 2014-08-23 NOTE — MAU Provider Note (Signed)
History     CSN: [id number]  Arrival date and time: 08/23/14 1703   None     Chief Complaint  Patient presents with  . Abdominal Pain   HPI Tamara Green is 34 y.o. [id number] [redacted]w[redacted]d weeks presenting with right lower abdominal pain but when it began last night it was "all over".  She is pregnant.  Plans care with Dr. Jodi Mourning.  Pain is described as dull and aching that radiates into her thighs when sitting.  Pain at its worst is 5/10 and now 4/10.  Changing positions seems to help.  She hasn't taken anything for pain.  Hx of  Second trimester miscarriages at 20 and 21 weeks, "incompetent cervix" and was afraid to take any medication.  Denies vaginal bleeding.  Nausea and vomiting-using ginger pills and is taking prenatal vitamins.     Past Medical History  Diagnosis Date  . Rheumatoid arthritis(714.0)   . Anemia   . Incompetent cervix     Past Surgical History  Procedure Laterality Date  . Breast biopsy      Family History  Problem Relation Age of Onset  . Diabetes Mother     Social History  Substance Use Topics  . Smoking status: Former Smoker -- 0.50 packs/day for 6 years    Types: Cigarettes    Quit date: 01/09/2012  . Smokeless tobacco: None  . Alcohol Use: No    Allergies:  Allergies  Allergen Reactions  . Percocet [Oxycodone-Acetaminophen] Hives    Prescriptions prior to admission  Medication Sig Dispense Refill Last Dose  . acetaminophen (TYLENOL) 325 MG tablet Take 650 mg by mouth every 6 (six) hours as needed for moderate pain or headache. For pain or headache   Past Month at Unknown time  . Aspirin-Acetaminophen-Caffeine (GOODYS EXTRA STRENGTH PO) Take 1 packet by mouth daily as needed (headache).   Past Month at Unknown time  . Prenatal Vit-Fe Fumarate-FA (PRENATAL MULTIVITAMIN) TABS tablet Take 1 tablet by mouth daily at 12 noon.   08/22/2014 at Unknown time  . ferrous sulfate 325 (65 FE) MG tablet Take 1 tablet (325 mg total) by mouth 3 (three)  times daily with meals. (Patient not taking: Reported on 08/23/2014) 90 tablet 0 Not Taking at Unknown time  . ondansetron (ZOFRAN ODT) 8 MG disintegrating tablet Take 1 tablet (8 mg total) by mouth every 8 (eight) hours as needed for nausea. (Patient not taking: Reported on 08/23/2014) 10 tablet 0 Not Taking at Unknown time    Review of Systems  Constitutional: Negative for fever and chills.  Respiratory: Negative for cough and shortness of breath.   Cardiovascular: Negative for chest pain.  Gastrointestinal: Positive for nausea, vomiting, abdominal pain and constipation. Negative for diarrhea.  Genitourinary: Negative for dysuria, urgency, frequency and hematuria.       Negative for vaginal bleeding or discharge.  Neurological: Negative for headaches.   Physical Exam   Blood pressure 126/71, pulse 96, temperature 98.9 F (37.2 C), temperature source Oral, resp. rate 20, height 5\' 6"  (1.676 m), weight 257 lb 9.6 oz (116.847 kg), last menstrual period 06/15/2014.  Physical Exam  Constitutional: She is oriented to person, place, and time. She appears well-developed and well-nourished.  HENT:  Head: Normocephalic.  Neck: Normal range of motion. Neck supple.  Cardiovascular: Normal rate, regular rhythm and normal heart sounds.   Respiratory: Effort normal and breath sounds normal. No respiratory distress.  GI: Soft. There is tenderness (mild tenderness in the lower right quadrant without  rebound/guarding).  Genitourinary: There is no rash, tenderness or lesion on the right labia. There is no rash, tenderness or lesion on the left labia. Uterus is enlarged. Uterus is not tender. Cervix exhibits no motion tenderness, no discharge and no friability. Right adnexum displays tenderness (moderate). Right adnexum displays no mass and no fullness. Left adnexum displays no mass, no tenderness and no fullness. No erythema, tenderness or bleeding in the vagina. Vaginal discharge (small amount of frothy  discharge wtihout odor.  ) found.  Cervix appears closed.  Musculoskeletal: Normal range of motion. She exhibits no edema.  Neurological: She is alert and oriented to person, place, and time.  Skin: Skin is warm and dry.  Psychiatric: She has a normal mood and affect. Her behavior is normal. Thought content normal.    Results for orders placed or performed during the hospital encounter of 08/23/14 (from the past 24 hour(s))  Urinalysis, Routine w reflex microscopic (not at [facility])     Status: Abnormal   Collection Time: 08/23/14  5:20 PM  Result Value Ref Range   Color, Urine YELLOW YELLOW   APPearance CLEAR CLEAR   Specific Gravity, Urine 1.020 1.005 - 1.030   pH 7.0 5.0 - 8.0   Glucose, UA NEGATIVE NEGATIVE mg/dL   Hgb urine dipstick NEGATIVE NEGATIVE   Bilirubin Urine NEGATIVE NEGATIVE   Ketones, ur NEGATIVE NEGATIVE mg/dL   Protein, ur 30 (A) NEGATIVE mg/dL   Urobilinogen, UA 0.2 0.0 - 1.0 mg/dL   Nitrite NEGATIVE NEGATIVE   Leukocytes, UA NEGATIVE NEGATIVE  Urine microscopic-add on     Status: Abnormal   Collection Time: 08/23/14  5:20 PM  Result Value Ref Range   Squamous Epithelial / LPF FEW (A) RARE   WBC, UA 0-2 <3 WBC/hpf  Pregnancy, urine POC     Status: Abnormal   Collection Time: 08/23/14  5:33 PM  Result Value Ref Range   Preg Test, Ur POSITIVE (A) NEGATIVE  Wet prep, genital     Status: Abnormal   Collection Time: 08/23/14  6:15 PM  Result Value Ref Range   Yeast Wet Prep HPF POC NONE SEEN NONE SEEN   Trich, Wet Prep NONE SEEN NONE SEEN   Clue Cells Wet Prep HPF POC NONE SEEN NONE SEEN   WBC, Wet Prep HPF POC MANY (A) NONE SEEN  CBC     Status: Abnormal   Collection Time: 08/23/14  6:30 PM  Result Value Ref Range   WBC 7.9 4.0 - 10.5 K/uL   RBC 3.81 (L) 3.87 - 5.11 MIL/uL   Hemoglobin 9.9 (L) 12.0 - 15.0 g/dL   HCT 29.7 (L) 36.0 - 46.0 %   MCV 78.0 78.0 - 100.0 fL   MCH 26.0 26.0 - 34.0 pg   MCHC 33.3 30.0 - 36.0 g/dL   RDW 19.2 (H) 11.5 - 15.5 %    Platelets 287 150 - 400 K/uL   O Negative Blood type by previous record.     Study Result     CLINICAL DATA: Vaginal bleeding. First trimester pregnancy. Patient is 9 weeks and 6 days pregnant based on her last menstrual period.  EXAM: OBSTETRIC <14 WK ULTRASOUND  TECHNIQUE: Transabdominal ultrasound was performed for evaluation of the gestation as well as the maternal uterus and adnexal regions.  COMPARISON: None.  FINDINGS: Intrauterine gestational sac: Visualized/normal in shape.  Yolk sac: No  Embryo: Yes  Cardiac Activity: Yes  Heart Rate: 167 bpm  CRL: 33 mm 10 w 2 d  [place] EDC: 03/19/2015  Maternal uterus/adnexae: Uterus demonstrates a pedunculated fibroid on the right measuring 3.8 x 3.7 x 4 cm. Mid uterine section mural fibroid also noted measuring 4.1 x 2.5 x 3.3 cm. No other uterine abnormality. No subchronic hemorrhage. Right ovary visualized, unremarkable. No left ovary visualized. No adnexal masses. No pelvic free fluid.  IMPRESSION: 1. Single live intrauterine pregnancy with a measured gestational age of 102 weeks and 2 days, concordant with the expected gestational age based on the last menstrual period. 2. There are 2 uterine fibroids, 1 pedunculated on the right and the other mural anterior to the gestational sac. This latter fibroid measures 4.1 cm in greatest dimension. 3. No other uterine abnormalities. No adnexal masses or pelvic free fluid. Normal right ovary visualized. Left ovary not visualized.   Electronically Signed  By: Lajean Manes M.D.  On: 08/23/2014 19:12   Ultrasound tech states patient had discomfort on the right during exam.  MAU Course  Procedures Gc/Chl culture and HIV pending  MDM MSE Labs Exam Ultrasound Consulted with Dr. Orvilla Fus U/S results; may give Rx for Percocet for pain and have her call Dr. Jacelyn Grip office tomorrow to make OB appt.  Patient allergic to  Percocet, states she can take Vicodin but she prefers to try Tylenol tonight because she doesn't want to use pain med in pregnancy.  Will call Dr. Jodi Mourning tomorrow if pain continues.   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 Jodi Mourning tomorrow to begin prenatal care      Patient unable to take Percocet because reactive to it in past-hives.  Discussed meds and she would like to try Tylenol tonight and if pain      continues will discuss with Dr. Jodi Mourning tomorrow.      Encouraged prenatal vitamins      Return for worsening sxs    Tori Dattilio,EVE M 08/23/2014, 7:23 PM

## 2014-08-23 NOTE — Discharge Instructions (Signed)
Abdominal Pain During Pregnancy Abdominal pain is common in pregnancy. Most of the time, it does not cause harm. There are many causes of abdominal pain. Some causes are more serious than others. Some of the causes of abdominal pain in pregnancy are easily diagnosed. Occasionally, the diagnosis takes time to understand. Other times, the cause is not determined. Abdominal pain can be a sign that something is very wrong with the pregnancy, or the pain may have nothing to do with the pregnancy at all. For this reason, always tell your health care provider if you have any abdominal discomfort. HOME CARE INSTRUCTIONS  Monitor your abdominal pain for any changes. The following actions may help to alleviate any discomfort you are experiencing:  Do not have sexual intercourse or put anything in your vagina until your symptoms go away completely.  Get plenty of rest until your pain improves.  Drink clear fluids if you feel nauseous. Avoid solid food as long as you are uncomfortable or nauseous.  Only take over-the-counter or prescription medicine as directed by your health care provider.  Keep all follow-up appointments with your health care provider. SEEK IMMEDIATE MEDICAL CARE IF:  You are bleeding, leaking fluid, or passing tissue from the vagina.  You have increasing pain or cramping.  You have persistent vomiting.  You have painful or bloody urination.  You have a fever.  You notice a decrease in your baby's movements.  You have extreme weakness or feel faint.  You have shortness of breath, with or without abdominal pain.  You develop a severe headache with abdominal pain.  You have abnormal vaginal discharge with abdominal pain.  You have persistent diarrhea.  You have abdominal pain that continues even after rest, or gets worse. MAKE SURE YOU:   Understand these instructions.  Will watch your condition.  Will get help right away if you are not doing well or get  worse. Document Released: 12/25/2004 Document Revised: 10/15/2012 Document Reviewed: 07/24/2012 [facility] Patient Information 2015 [place], [place]. This information is not intended to replace advice given to you by your health care provider. Make sure you discuss any questions you have with your health care provider. Uterine Fibroid A uterine fibroid is a growth (tumor) in your uterus. It is not cancerous. Some fibroids cause pain or heavy bleeding during and in between periods. [place] care depends on how you were treated. In general:  Keep all doctor visits.  Only take medicine as told by your doctor. Do not take aspirin.  Talk to your doctor about taking iron pills.  If you have painful periods that are not heavy, lie down with your feet up. Put cold packs on your belly (abdomen).  If you have heavy periods, tell your doctor how many pads or tampons you use in a month.  Eat green vegetables. GET HELP RIGHT AWAY IF:   You have lower belly (pelvic) pain or cramps not helped by medicine.  You have sudden lower belly pain that gets worse.  You have more bleeding between and during your periods.  If you soak your tampon or pad in 30 minutes or less.  You feel lightheaded or faint. MAKE SURE YOU:   Understand these instructions.  Will watch your condition.  Will get help right away if you are not doing well or get worse. Document Released: 03/23/2008 Document Revised: 10/15/2012 Document Reviewed: 07/24/2012 [facility] Patient Information 2015 [place], [place]. This information is not intended to replace advice given to you by your health  care provider. Make sure you discuss any questions you have with your health care provider.

## 2014-08-23 NOTE — MAU Note (Signed)
Pain started yesterday, in lower abd.  Today it has been more on right side, radiating into leg.

## 2014-08-24 LAB — GC/CHLAMYDIA PROBE AMP (~~LOC~~) NOT AT ARMC
CHLAMYDIA, DNA PROBE: NEGATIVE
NEISSERIA GONORRHEA: NEGATIVE

## 2014-08-24 LAB — HIV ANTIBODY (ROUTINE TESTING W REFLEX): HIV Screen 4th Generation wRfx: NONREACTIVE

## 2014-09-01 ENCOUNTER — Telehealth: Payer: Self-pay | Admitting: Obstetrics

## 2014-09-03 NOTE — Telephone Encounter (Signed)
[id number] - patient appt scheduled

## 2014-09-06 ENCOUNTER — Encounter: Payer: Self-pay | Admitting: Obstetrics

## 2014-09-07 ENCOUNTER — Ambulatory Visit (INDEPENDENT_AMBULATORY_CARE_PROVIDER_SITE_OTHER): Payer: 59 | Admitting: Obstetrics

## 2014-09-07 VITALS — BP 129/76 | HR 93 | Temp 98.5°F | Wt 266.2 lb

## 2014-09-07 DIAGNOSIS — Z3481 Encounter for supervision of other normal pregnancy, first trimester: Secondary | ICD-10-CM | POA: Diagnosis not present

## 2014-09-07 DIAGNOSIS — O09521 Supervision of elderly multigravida, first trimester: Secondary | ICD-10-CM

## 2014-09-07 DIAGNOSIS — O219 Vomiting of pregnancy, unspecified: Secondary | ICD-10-CM

## 2014-09-07 DIAGNOSIS — O3431 Maternal care for cervical incompetence, first trimester: Secondary | ICD-10-CM

## 2014-09-07 LAB — POCT URINALYSIS DIPSTICK
Bilirubin, UA: NEGATIVE
Blood, UA: NEGATIVE
Glucose, UA: NORMAL
KETONES UA: NEGATIVE
LEUKOCYTES UA: NEGATIVE
NITRITE UA: NEGATIVE
PROTEIN UA: NEGATIVE
Spec Grav, UA: 1.015
Urobilinogen, UA: NEGATIVE
pH, UA: 5

## 2014-09-07 LAB — TSH: TSH: 0.291 u[IU]/mL — ABNORMAL LOW (ref 0.350–4.500)

## 2014-09-07 MED ORDER — DOXYLAMINE-PYRIDOXINE 10-10 MG PO TBEC: DELAYED_RELEASE_TABLET | ORAL | Status: DC

## 2014-09-08 ENCOUNTER — Telehealth: Payer: Self-pay | Admitting: *Deleted

## 2014-09-08 ENCOUNTER — Encounter: Payer: Self-pay | Admitting: Obstetrics

## 2014-09-08 LAB — OBSTETRIC PANEL
ANTIBODY SCREEN: NEGATIVE
BASOS ABS: 0 10*3/uL (ref 0.0–0.1)
Basophils Relative: 0 % (ref 0–1)
EOS ABS: 0.1 10*3/uL (ref 0.0–0.7)
EOS PCT: 1 % (ref 0–5)
HCT: 28.2 % — ABNORMAL LOW (ref 36.0–46.0)
HEMOGLOBIN: 9.6 g/dL — AB (ref 12.0–15.0)
Hepatitis B Surface Ag: NEGATIVE
LYMPHS ABS: 2.5 10*3/uL (ref 0.7–4.0)
Lymphocytes Relative: 28 % (ref 12–46)
MCH: 26.3 pg (ref 26.0–34.0)
MCHC: 34 g/dL (ref 30.0–36.0)
MCV: 77.3 fL — ABNORMAL LOW (ref 78.0–100.0)
MPV: 8.7 fL (ref 8.6–12.4)
Monocytes Absolute: 0.7 10*3/uL (ref 0.1–1.0)
Monocytes Relative: 8 % (ref 3–12)
Neutro Abs: 5.7 10*3/uL (ref 1.7–7.7)
Neutrophils Relative %: 63 % (ref 43–77)
Platelets: 300 10*3/uL (ref 150–400)
RBC: 3.65 MIL/uL — AB (ref 3.87–5.11)
RDW: 19 % — ABNORMAL HIGH (ref 11.5–15.5)
RUBELLA: 0.44 {index} (ref <0.90)
Rh Type: NEGATIVE
WBC: 9 10*3/uL (ref 4.0–10.5)

## 2014-09-08 LAB — VITAMIN D 25 HYDROXY (VIT D DEFICIENCY, FRACTURES): Vit D, 25-Hydroxy: 15 ng/mL — ABNORMAL LOW (ref 30–100)

## 2014-09-08 LAB — VARICELLA ZOSTER ANTIBODY, IGG: Varicella IgG: 820 Index — ABNORMAL HIGH (ref <135.00)

## 2014-09-08 NOTE — Telephone Encounter (Signed)
Call placed to pt.  In need of Medicaid information so that a Prior approval can be submitted for Diclegis Rx. Do not see medicaid in her chart.  Pt has [place] and now states she has Medicaid as well.  LM on VM to contact office so that we may get Medicaid information.

## 2014-09-08 NOTE — Progress Notes (Signed)
Subjective:    Tamara Green is being seen today for her first obstetrical visit.  This is not a planned pregnancy. She is at [redacted]w[redacted]d gestation. Her obstetrical history is significant for advanced maternal age. Relationship with FOB: unknown. Patient does intend to breast feed. Pregnancy history fully reviewed.  The information documented in the HPI was reviewed and verified.  Menstrual History: OB History    Gravida Para Term Preterm AB TAB SAB Ectopic Multiple Living   6 3 2 1 2  2   3                                                                                                                                                                                                                                                                                         Patient's last menstrual period was 06/15/2014.    Past Medical History  Diagnosis Date  . Rheumatoid arthritis(714.0)   . Anemia   . Incompetent cervix     Past Surgical History  Procedure Laterality Date  . Breast biopsy       (Not in a hospital admission) Allergies  Allergen Reactions  . Percocet [Oxycodone-Acetaminophen] Hives    Social History  Substance Use Topics  . Smoking status: Former Smoker -- 0.50 packs/day for 6 years    Types: Cigarettes    Quit date: 01/09/2012  . Smokeless tobacco: Not on file  . Alcohol Use: No    Family History  Problem Relation Age of Onset  . Diabetes Mother      Review of Systems Constitutional: negative for weight loss Gastrointestinal: negative for vomiting Genitourinary:negative for genital lesions and vaginal discharge and dysuria Musculoskeletal:negative for back pain Behavioral/Psych: negative for abusive relationship, depression, illegal drug usage and tobacco use    Objective:    BP 129/76 mmHg  Pulse 93  Temp(Src) 98.5 F (36.9 C)  Wt 266 lb 3.2 oz (120.748 kg)  LMP 06/15/2014 General Appearance:    Alert, cooperative, no distress, appears  stated age  Head:    Normocephalic, without obvious abnormality, atraumatic  Eyes:    PERRL, conjunctiva/corneas clear, EOM's intact, fundi  benign, both eyes  Ears:    Normal TM's and external ear canals, both ears  Nose:   Nares normal, septum midline, mucosa normal, no drainage    or sinus tenderness  Throat:   Lips, mucosa, and tongue normal; teeth and gums normal  Neck:   Supple, symmetrical, trachea midline, no adenopathy;    thyroid:  no enlargement/tenderness/nodules; no carotid   bruit or JVD  Back:     Symmetric, no curvature, ROM normal, no CVA tenderness  Lungs:     Clear to auscultation bilaterally, respirations unlabored  Chest Wall:    No tenderness or deformity   Heart:    Regular rate and rhythm, S1 and S2 normal, no murmur, rub   or gallop  Breast Exam:    No tenderness, masses, or nipple abnormality  Abdomen:     Soft, non-tender, bowel sounds active all four quadrants,    no masses, no organomegaly  Genitalia:    Normal female without lesion, discharge or tenderness  Extremities:   Extremities normal, atraumatic, no cyanosis or edema  Pulses:   2+ and symmetric all extremities  Skin:   Skin color, texture, turgor normal, no rashes or lesions  Lymph nodes:   Cervical, supraclavicular, and axillary nodes normal  Neurologic:   CNII-XII intact, normal strength, sensation and reflexes    throughout      Lab Review Urine pregnancy test Labs reviewed yes Radiologic studies reviewed yes Assessment:    Pregnancy at [redacted]w[redacted]d weeks    Incompetent cervix  AMA   Plan:   Referred to MFM Schedule cerclage at 13 weeks.    Prenatal vitamins.  Counseling provided regarding continued use of seat belts, cessation of alcohol consumption, smoking or use of illicit drugs; infection precautions i.e., influenza/TDAP immunizations, toxoplasmosis,CMV, parvovirus, listeria and varicella; workplace safety, exercise during pregnancy; routine dental care, safe medications, sexual  activity, hot tubs, saunas, pools, travel, caffeine use, fish and methlymercury, potential toxins, hair treatments, varicose veins Weight gain recommendations per IOM guidelines reviewed: underweight/BMI< 18.5--> gain 28 - 40 lbs; normal weight/BMI 18.5 - 24.9--> gain 25 - 35 lbs; overweight/BMI 25 - 29.9--> gain 15 - 25 lbs; obese/BMI >30->gain  11 - 20 lbs Problem list reviewed and updated. FIRST/CF mutation testing/NIPT/QUAD SCREEN/fragile X/Ashkenazi Jewish population testing/Spinal muscular atrophy discussed: requested. Role of ultrasound in pregnancy discussed; fetal survey: requested. Amniocentesis discussed: not indicated. VBAC calculator score: VBAC consent form provided Meds ordered this encounter  Medications  . Doxylamine-Pyridoxine (DICLEGIS) 10-10 MG TBEC    Sig: 1 tab in AM, 1 tab mid afternoon 2 tabs at bedtime. Max dose 4 tabs daily.    Dispense:  100 tablet    Refill:  5   Orders Placed This Encounter  Procedures  . Culture, OB Urine  . US Fetal Nuchal Translucency Measurement    Standing Status: Future     Number of Occurrences:      Standing Expiration Date: 11/07/2015    Order Specific Question:  Reason for Exam (SYMPTOM  OR DIAGNOSIS REQUIRED)    Answer:  Incompetent cervix.  AMA    Order Specific Question:  Preferred imaging location?    Answer:  [facility]-Ul[facility]ound  . US OB Comp Less 14 Wks    Standing Status: Future     Number of Occurrences:      Standing Expiration Date: 11/07/2015    Order Specific Question:  Reason for Exam (SYMPTOM  OR DIAGNOSIS REQUIRED)    Answer:  Incompetent cervix.  [place]  Order Specific Question:  Preferred imaging location?    Answer:  [facility]Ultras[facility]  . Obstetric panel  . Hemoglobinopathy evaluation  . Varicella zoster antibody, IgG  . Vit D  25 hydroxy (rtn osteoporosis monitoring)  . TSH  . AMB Referral to Maternal Fetal Medicine (MFM)    Referral Priority:  Routine    Referral Type:  Consultation    Number of Visits  Requested:  1  . POCT urinalysis dipstick    Follow up in 4 weeks

## 2014-09-09 ENCOUNTER — Encounter (HOSPITAL_COMMUNITY): Payer: Self-pay

## 2014-09-09 ENCOUNTER — Inpatient Hospital Stay (HOSPITAL_COMMUNITY)
Admission: AD | Admit: 2014-09-09 | Discharge: 2014-09-09 | Disposition: A | Discharge status: Home / Self Care | Payer: Medicaid Other | Source: Ambulatory Visit | Attending: Obstetrics | Admitting: Obstetrics

## 2014-09-09 ENCOUNTER — Inpatient Hospital Stay (HOSPITAL_COMMUNITY): Payer: Medicaid Other

## 2014-09-09 DIAGNOSIS — Z3A12 12 weeks gestation of pregnancy: Secondary | ICD-10-CM | POA: Insufficient documentation

## 2014-09-09 DIAGNOSIS — O209 Hemorrhage in early pregnancy, unspecified: Secondary | ICD-10-CM

## 2014-09-09 DIAGNOSIS — O09521 Supervision of elderly multigravida, first trimester: Secondary | ICD-10-CM | POA: Insufficient documentation

## 2014-09-09 DIAGNOSIS — O09291 Supervision of pregnancy with other poor reproductive or obstetric history, first trimester: Secondary | ICD-10-CM

## 2014-09-09 DIAGNOSIS — D259 Leiomyoma of uterus, unspecified: Secondary | ICD-10-CM | POA: Insufficient documentation

## 2014-09-09 DIAGNOSIS — O3431 Maternal care for cervical incompetence, first trimester: Secondary | ICD-10-CM | POA: Diagnosis not present

## 2014-09-09 DIAGNOSIS — O3411 Maternal care for benign tumor of corpus uteri, first trimester: Secondary | ICD-10-CM | POA: Insufficient documentation

## 2014-09-09 LAB — HEMOGLOBINOPATHY EVALUATION
HEMOGLOBIN OTHER: 0 %
HGB A: 97.7 % (ref 96.8–97.8)
HGB F QUANT: 0 % (ref 0.0–2.0)
HGB S QUANTITAION: 0 %
Hgb A2 Quant: 2.3 % (ref 2.2–3.2)

## 2014-09-09 LAB — URINALYSIS, ROUTINE W REFLEX MICROSCOPIC
Bilirubin Urine: NEGATIVE
Glucose, UA: NEGATIVE mg/dL
Ketones, ur: NEGATIVE mg/dL
LEUKOCYTES UA: NEGATIVE
NITRITE: NEGATIVE
Protein, ur: NEGATIVE mg/dL
SPECIFIC GRAVITY, URINE: 1.01 (ref 1.005–1.030)
UROBILINOGEN UA: 0.2 mg/dL (ref 0.0–1.0)
pH: 6.5 (ref 5.0–8.0)

## 2014-09-09 LAB — PAP, TP IMAGING W/ HPV RNA, RFLX HPV TYPE 16,18/45: HPV mRNA, High Risk: DETECTED — AB

## 2014-09-09 LAB — URINE MICROSCOPIC-ADD ON

## 2014-09-09 LAB — CULTURE, OB URINE
COLONY COUNT: NO GROWTH
Organism ID, Bacteria: NO GROWTH

## 2014-09-09 MED ORDER — RHO D IMMUNE GLOBULIN 1500 UNIT/2ML IJ SOSY
300.0000 ug | PREFILLED_SYRINGE | Freq: Once | INTRAMUSCULAR | Status: AC
  Administered 2014-09-09: 300 ug via INTRAMUSCULAR
  Filled 2014-09-09: qty 2

## 2014-09-09 NOTE — MAU Provider Note (Signed)
History     CSN: [id number]  Arrival date and time: 09/09/14 4193   None     Chief Complaint  Patient presents with  . Vaginal Bleeding  . Abdominal Cramping   HPI   Ms.Tamara Green is a 34 y.o. [id number] at [redacted]w[redacted]d presenting with vaginal bleeding. She describes bright red bleeding with blood clots, noticed in the toilet first and then on her toilet paper. The bleeding was first noted this morning when she woke up.   She has a history of Incompetent cervix; and plans to have a cerclage placed @ 13 weeks.   +AMA  She has a history of 2 second trimester losses.   OB History    Gravida Para Term Preterm AB TAB SAB Ectopic Multiple Living   6 3 2 1 2  2   3       Past Medical History  Diagnosis Date  . Rheumatoid arthritis(714.0)   . Anemia   . Incompetent cervix     Past Surgical History  Procedure Laterality Date  . Breast biopsy      Family History  Problem Relation Age of Onset  . Diabetes Mother     Social History  Substance Use Topics  . Smoking status: Former Smoker -- 0.50 packs/day for 6 years    Types: Cigarettes    Quit date: 01/09/2012  . Smokeless tobacco: None  . Alcohol Use: No    Allergies:  Allergies  Allergen Reactions  . Percocet [Oxycodone-Acetaminophen] Hives    Prescriptions prior to admission  Medication Sig Dispense Refill Last Dose  . acetaminophen (TYLENOL) 325 MG tablet Take 650 mg by mouth every 6 (six) hours as needed for moderate pain or headache. For pain or headache   Taking  . Aspirin-Acetaminophen-Caffeine (GOODYS EXTRA STRENGTH PO) Take 1 packet by mouth daily as needed (headache).   Taking  . Doxylamine-Pyridoxine (DICLEGIS) 10-10 MG TBEC 1 tab in AM, 1 tab mid afternoon 2 tabs at bedtime. Max dose 4 tabs daily. 100 tablet 5   . ferrous sulfate 325 (65 FE) MG tablet Take 1 tablet (325 mg total) by mouth 3 (three) times daily with meals. (Patient not taking: Reported on 08/23/2014) 90 tablet 0 Not Taking  .  ondansetron (ZOFRAN ODT) 8 MG disintegrating tablet Take 1 tablet (8 mg total) by mouth every 8 (eight) hours as needed for nausea. (Patient not taking: Reported on 08/23/2014) 10 tablet 0 Not Taking  . Prenatal Vit-Fe Fumarate-FA (PRENATAL MULTIVITAMIN) TABS tablet Take 1 tablet by mouth daily at 12 noon.   Taking    US Ob Comp Less 14 Wks  09/09/2014   CLINICAL DATA:  Cramping.  Vaginal bleeding.  EXAM: OBSTETRIC <14 WK ULTRASOUND  TECHNIQUE: Transabdominal ultrasound was performed for evaluation of the gestation as well as the maternal uterus and adnexal regions.  COMPARISON:  None.  FINDINGS: Intrauterine gestational sac: Single  Yolk sac:  No  Embryo:  Yes  Cardiac Activity: Yes  Heart Rate: 171 bpm  CRL:   63.5  mm   12 w 5 d                  [place] EDC: 03/19/2015  Maternal uterus/adnexae:  Subchorionic hemorrhage: None  Right ovary: Not well visualized  Left ovary: Normal  Other :Several fibroids are identified. Posteriorly there is a 2.6 cm fibroid. There is a pedunculated fibroid arising from the right side of the uterus measuring 4.1 x 3.8 x 3.9 cm  Free  fluid:  None  IMPRESSION: 1. Single living intrauterine gestation. The estimated gestational age is 27 weeks and 5 days. No complications. 2. Fibroids.   Electronically Signed   By: Kerby Moors M.D.   On: 09/09/2014 09:29    Results for orders placed or performed during the hospital encounter of 09/09/14 (from the past 48 hour(s))  Urinalysis, Routine w reflex microscopic (not at [facility])     Status: Abnormal   Collection Time: 09/09/14  7:30 AM  Result Value Ref Range   Color, Urine YELLOW YELLOW   APPearance CLEAR CLEAR   Specific Gravity, Urine 1.010 1.005 - 1.030   pH 6.5 5.0 - 8.0   Glucose, UA NEGATIVE NEGATIVE mg/dL   Hgb urine dipstick LARGE (A) NEGATIVE   Bilirubin Urine NEGATIVE NEGATIVE   Ketones, ur NEGATIVE NEGATIVE mg/dL   Protein, ur NEGATIVE NEGATIVE mg/dL   Urobilinogen, UA 0.2 0.0 - 1.0 mg/dL   Nitrite NEGATIVE NEGATIVE    Leukocytes, UA NEGATIVE NEGATIVE  Urine microscopic-add on     Status: Abnormal   Collection Time: 09/09/14  7:30 AM  Result Value Ref Range   Squamous Epithelial / LPF FEW (A) RARE   WBC, UA 0-2 <3 WBC/hpf   RBC / HPF 7-10 <3 RBC/hpf   Bacteria, UA FEW (A) RARE  Rh IG workup (includes ABO/Rh)     Status: None (Preliminary result)   Collection Time: 09/09/14  8:45 AM  Result Value Ref Range   Gestational Age(Wks) 12    ABO/RH(D) O NEG    Antibody Screen NEG    Unit Number [id number]    Blood Component Type RHIG    Unit division 00    Status of Unit ISSUED    Transfusion Status OK TO TRANSFUSE      Review of Systems  Constitutional: Negative for fever and chills.  Gastrointestinal: Positive for abdominal pain (Lower abdominal cramping ).   Physical Exam   Blood pressure 133/78, pulse 81, temperature 98.2 F (36.8 C), resp. rate 16, height 5\' 6"  (1.676 m), weight 120.838 kg (266 lb 6.4 oz), last menstrual period 06/15/2014.  Physical Exam  Constitutional: She is oriented to person, place, and time. She appears well-developed and well-nourished. No distress.  HENT:  Head: Normocephalic.  Eyes: Pupils are equal, round, and reactive to light.  Neck: Neck supple.  Respiratory: Effort normal.  GI: Soft. She exhibits no distension. There is no tenderness. There is no rebound.  Genitourinary:  Speculum exam: Vagina - Small amount of dark red blood oozing from the cervix. No clots noted  Cervix - + active bleeding  Bimanual exam: Cervix: external os FT, internal os closed  Uterus non tender, normal size Adnexa non tender, no masses bilaterally  Chaperone present for exam.  Musculoskeletal: Normal range of motion.  Neurological: She is alert and oriented to person, place, and time.  Skin: Skin is warm. She is not diaphoretic.  Psychiatric: Her behavior is normal.    MAU Course  Procedures  None  MDM  + fetal heart tones by doppler US  O negative blood type   Rhogam given.   Discussed patient with Dr. Jodi Mourning who would like to consult with MFM> Dr. Jodi Mourning to call MFM for consult prior to the patient leaving MAU   Assessment and Plan   A:  1. Vaginal bleeding in pregnancy, first trimester   2. Hx of cervical incompetence in pregnancy, currently pregnant, first trimester   3. Uterine leiomyoma, unspecified location  P:  Discharge home in stable condition Modified bedrest; letter given for work.  Pelvic rest MFM would like to see the patient Tuesday and will call to schedule the appointment Return to MAU if symptoms worsen      Lezlie Lye, NP 09/09/2014 12:05 PM

## 2014-09-09 NOTE — MAU Note (Signed)
Pt stated she went to the [facility] this morning noticed some drops of blood and a small blood clot. And c/o cramping.

## 2014-09-10 LAB — RH IG WORKUP (INCLUDES ABO/RH)
ABO/RH(D): O NEG
ANTIBODY SCREEN: NEGATIVE
GESTATIONAL AGE(WKS): 12
Unit division: 0

## 2014-09-10 LAB — HPV TYPE 16 AND 18/45 RNA
HPV Type 16 RNA: DETECTED — AB
HPV Type 18/45 RNA: NOT DETECTED

## 2014-09-14 ENCOUNTER — Ambulatory Visit (HOSPITAL_COMMUNITY)
Admit: 2014-09-14 | Discharge: 2014-09-14 | Disposition: A | Discharge status: Home / Self Care | Payer: Medicaid Other | Attending: Obstetrics | Admitting: Obstetrics

## 2014-09-14 ENCOUNTER — Encounter (HOSPITAL_COMMUNITY): Payer: Self-pay

## 2014-09-14 ENCOUNTER — Other Ambulatory Visit: Payer: Self-pay | Admitting: Obstetrics

## 2014-09-14 ENCOUNTER — Encounter: Payer: Self-pay | Admitting: *Deleted

## 2014-09-14 ENCOUNTER — Ambulatory Visit (HOSPITAL_COMMUNITY)
Admission: RE | Admit: 2014-09-14 | Discharge: 2014-09-14 | Disposition: A | Discharge status: Home / Self Care | Payer: Medicaid Other | Source: Ambulatory Visit | Attending: Obstetrics | Admitting: Obstetrics

## 2014-09-14 ENCOUNTER — Other Ambulatory Visit: Payer: Self-pay | Admitting: *Deleted

## 2014-09-14 ENCOUNTER — Encounter (HOSPITAL_COMMUNITY): Payer: Self-pay | Admitting: *Deleted

## 2014-09-14 VITALS — BP 122/77 | HR 98 | Wt 259.4 lb

## 2014-09-14 DIAGNOSIS — O09522 Supervision of elderly multigravida, second trimester: Secondary | ICD-10-CM

## 2014-09-14 DIAGNOSIS — O09529 Supervision of elderly multigravida, unspecified trimester: Secondary | ICD-10-CM | POA: Insufficient documentation

## 2014-09-14 DIAGNOSIS — O3431 Maternal care for cervical incompetence, first trimester: Secondary | ICD-10-CM

## 2014-09-14 NOTE — Progress Notes (Signed)
Genetic Counseling  High-Risk Gestation Note  Appointment Date:  09/14/2014 Referred By: Shelly Bombard, MD Date of Birth:  July 13, 1980   Pregnancy History: [id number] Estimated Date of Delivery: 03/22/15 Estimated Gestational Age: [redacted]w[redacted]d Attending: Benjaman Lobe, MD   Tamara Green was seen for genetic counseling because of a maternal age of 34 y.o..     In Summary:   Reviewed advanced maternal age and associated risks for fetal aneuploidy  Patient elected to pursue NIPS (Panorama) today; declined amniocentesis and maternal serum screening  Patient would like to return for detailed ultrasound, scheduled for 10/19/14  MFM consultation today to discuss cerclage placement; See separate MFM note  Follow-up ultrasound also scheduled for 10/05/14 to assess cervical length.   She was counseled regarding maternal age and the association with risk for chromosome conditions due to nondisjunction with aging of the ova.   We reviewed chromosomes, nondisjunction, and the associated 1 in 114 risk for fetal aneuploidy related to a maternal age of 34 y.o. at [redacted]w[redacted]d gestation.  She was counseled that the risk for aneuploidy decreases as gestational age increases, accounting for those pregnancies which spontaneously abort.  We specifically discussed Down syndrome (trisomy 66), trisomies 29 and 40, and sex chromosome aneuploidies (47,XXX and 47,XXY) including the common features and prognoses of each.   We reviewed available screening options including First Screen, Quad screen, noninvasive prenatal screening (NIPS)/cell free DNA (cfDNA) testing, and detailed ultrasound.  She was counseled that screening tests are used to modify a patient's a priori risk for aneuploidy, typically based on age. This estimate provides a pregnancy specific risk assessment. We reviewed the benefits and limitations of each option. Specifically, we discussed the conditions for which each test screens, the detection  rates, and false positive rates of each. She was also counseled regarding diagnostic testing via amniocentesis.  We reviewed the approximate 1 in 938-101 risk for complications for amniocentesis, including spontaneous pregnancy loss. After consideration of all the options, she elected to proceed with NIPS (Panorama through [facility] laboratory).  Those results will be available in 8-10 days.  The patient declined amniocentesis given the associated risk of complications.   The patient would like to return for a detailed ultrasound at ~18+ weeks gestation.  This appointment was for 10/19/14. She understands that screening tests cannot rule out all birth defects or genetic syndromes. The patient was advised of this limitation and states she still does not want additional testing at this time.   Mrs. Laqueta Linden A Green was provided with written information regarding sickle cell anemia (SCA) including the carrier frequency and incidence in the African-American population, the availability of carrier testing and prenatal diagnosis if indicated.  In addition, we discussed that hemoglobinopathies are routinely screened for as part of the [place] newborn screening panel.  She previously had hemoglobin electrophoresis performed through her OB office, which was within normal limits.  Both family histories were reviewed and found to be contributory for a "distant cousin" to the father of the pregnancy who was born with paraplegia. The patient had limited information regarding this female relative but reported that her mother possibly had seizures during the pregnancy with her. No additional relatives were reported with similar features. We discussed that additional information is needed regarding the specific medical features and etiology but given the reported information, recurrence risk for extended relatives is likely low. Without further information regarding the provided family history, an accurate genetic risk cannot  be calculated. Further genetic counseling is warranted if  more information is obtained.  Mrs. Todd A Green denied exposure to environmental toxins or chemical agents. She denied the use of alcohol, tobacco or street drugs. She denied significant viral illnesses during the course of her pregnancy. Her medical and surgical histories were contributory for history of preterm delivery and history of cerclage placement in previous pregnancies. Please see separate MFM consult note from today's visit for detailed discussion. She reported a personal history of rheumatoid arthritis but reported that her symptoms have been improved in the past 5 months, and she has not required medication during this time.    I counseled Mrs. [place] A Green regarding the above risks and available options.  The approximate face-to-face time with the genetic counselor was 40 minutes.  Chipper Oman, MS,  Certified Genetic Counselor 09/14/2014

## 2014-09-14 NOTE — Consult Note (Signed)
Maternal Fetal Medicine Consultation  Requesting Provider(s): Baltazar Najjar, MD  Reason for consultation: Hx of two previous mid-trimester losses, recommendations regarding cerclage  HPI: Tamara Green is a 34 yo [id number], EDD 03/22/2014 who is currently at [facility] seen for consultation regarding possible cerclage.  Tamara Green gives the following obstetrical history:  G1 - 1999 - 20 week delivery - presented with advanced cervical dilation and delivered shortly thereafter G2 - 2000 - 21 weeks - "bag protruding out of cervix" - delivered shortly after admission.  No felt to be candidate for rescue cerclage G3 - 2003 - underwent cerclage at ~ 12 weeks.  Was admitted for preterm labor at approximately 5 months - delivered at 34 weeks via SVD.  GDM G4 - 2004 - underwent prophylactic cerclage - term SVD without complications G5 - 3704 - underwent prophylactic cerclage - term SVD without complications G6 - current pregnancy  Tamara Green reports some recent vaginal bleeding.  She was seen in MAU last Thursday due to passage of some clots.  Ultrasound did not reveal a subchorionic hemorrhage, but noted to have several myomas. She reports some vaginal spotting after wiping today, but not heavy bleeding.  She is otherwise without complaints.  OB History: OB History    Gravida Para Term Preterm AB TAB SAB Ectopic Multiple Living   6 3 2 1 2  2   3       PMH:  Past Medical History  Diagnosis Date  . Rheumatoid arthritis(714.0)   . Anemia   . Incompetent cervix     PSH:  Past Surgical History  Procedure Laterality Date  . Breast biopsy     Meds:  Current Outpatient Prescriptions on File Prior to Encounter  Medication Sig Dispense Refill  . Doxylamine-Pyridoxine (DICLEGIS) 10-10 MG TBEC 1 tab in AM, 1 tab mid afternoon 2 tabs at bedtime. Max dose 4 tabs daily. (Patient taking differently: Take 2 tablets by mouth at bedtime as needed (for nausea). ) 100 tablet 5  . ferrous  sulfate 325 (65 FE) MG tablet Take 1 tablet (325 mg total) by mouth 3 (three) times daily with meals. (Patient not taking: Reported on 08/23/2014) 90 tablet 0  . Prenatal Vit-Fe Fumarate-FA (PRENATAL MULTIVITAMIN) TABS tablet Take 1 tablet by mouth daily at 12 noon.     No current facility-administered medications on file prior to encounter.   Allergies:  Allergies  Allergen Reactions  . Percocet [Oxycodone-Acetaminophen] Hives   FH:  Family History  Problem Relation Age of Onset  . Diabetes Mother     Soc:  Social History   Social History  . Marital Status: Married    Spouse Name: N/A  . Number of Children: N/A  . Years of Education: N/A   Occupational History  . Not on file.   Social History Main Topics  . Smoking status: Former Smoker -- 0.50 packs/day for 6 years    Types: Cigarettes    Quit date: 01/09/2012  . Smokeless tobacco: Not on file  . Alcohol Use: No  . Drug Use: No  . Sexual Activity: Not on file   Other Topics Concern  . Not on file   Social History Narrative    Review of Systems: no vaginal bleeding or cramping/contractions, no LOF, no nausea/vomiting. All other systems reviewed and are negative.  PE:   Filed Vitals:   09/14/14 0941  BP: 122/77  Pulse: 98    Labs: CBC    Component Value Date/Time  WBC 9.0 09/07/2014 1725   RBC 3.65* 09/07/2014 1725   HGB 9.6* 09/07/2014 1725   HCT 28.2* 09/07/2014 1725   HCT 23.0* 02/03/2014 2132   PLT 300 09/07/2014 1725   MCV 77.3* 09/07/2014 1725   MCH 26.3 09/07/2014 1725   MCHC 34.0 09/07/2014 1725   RDW 19.0* 09/07/2014 1725   LYMPHSABS 2.5 09/07/2014 1725   MONOABS 0.7 09/07/2014 1725   EOSABS 0.1 09/07/2014 1725   BASOSABS 0.0 09/07/2014 1725     A/P: 1) Single IUP at 13w 0d  2) Hx of two previous midtrimester losses - the patient's history is very suggestive of cervical insufficiency.  The fact that she has had some vaginal bleeding is a little concerning, but at this time, there is  no evidence of subchorionic hemorrhage and the vaginal bleeding has improved over the last several days.  Based on the patient's history, I would offer prophylactic cerclage.  If she begins to have worsening vaginal bleeding, it would be reasonable to postpone the procedure until it improves (vaginal bleeding may be associated with a lower chance for success with cerclage)  3) History of Rheumatoid arthritis - in general, rheumatoid arthritis improves over the course of pregnancy, but may worsen post partum.  She is not currently on any medications - would offer a rheumatology consultation if the patient becomes symptomatic.  4) Anemia - mild anemia noted - likely iron deficiency anemia.  May need a more detailed work up (i.e Hb electrophoresis and iron studies) if there is no improvement with iron supplementation  5) Advanced maternal age - see separate note from [employer] counselor today.    Thank you for the opportunity to be a part of the care of [place]. Please contact our office if we can be of further assistance.   I spent approximately 30 minutes with this patient with over 50% of time spent in face-to-face counseling.  Benjaman Lobe, MD Maternal Fetal Medicine

## 2014-09-16 MED ORDER — DEXTROSE 5 % IV SOLN
3.0000 g | INTRAVENOUS | Status: AC
  Administered 2014-09-17: 3 g via INTRAVENOUS
  Filled 2014-09-16: qty 3000

## 2014-09-17 ENCOUNTER — Ambulatory Visit (HOSPITAL_COMMUNITY): Payer: Medicaid Other | Admitting: Anesthesiology

## 2014-09-17 ENCOUNTER — Ambulatory Visit (HOSPITAL_COMMUNITY)
Admission: RE | Admit: 2014-09-17 | Discharge: 2014-09-17 | Disposition: A | Discharge status: Home / Self Care | Payer: Medicaid Other | Source: Ambulatory Visit | Attending: Obstetrics | Admitting: Obstetrics

## 2014-09-17 ENCOUNTER — Encounter (HOSPITAL_COMMUNITY): Payer: Self-pay | Admitting: Anesthesiology

## 2014-09-17 ENCOUNTER — Encounter (HOSPITAL_COMMUNITY)
Admission: RE | Disposition: A | Discharge status: Home / Self Care | Payer: Self-pay | Source: Ambulatory Visit | Attending: Obstetrics

## 2014-09-17 ENCOUNTER — Other Ambulatory Visit: Payer: Self-pay | Admitting: Obstetrics

## 2014-09-17 DIAGNOSIS — G8918 Other acute postprocedural pain: Secondary | ICD-10-CM

## 2014-09-17 DIAGNOSIS — Z3A13 13 weeks gestation of pregnancy: Secondary | ICD-10-CM | POA: Diagnosis not present

## 2014-09-17 DIAGNOSIS — O26891 Other specified pregnancy related conditions, first trimester: Secondary | ICD-10-CM | POA: Insufficient documentation

## 2014-09-17 DIAGNOSIS — Z862 Personal history of diseases of the blood and blood-forming organs and certain disorders involving the immune mechanism: Secondary | ICD-10-CM | POA: Diagnosis not present

## 2014-09-17 DIAGNOSIS — M069 Rheumatoid arthritis, unspecified: Secondary | ICD-10-CM | POA: Insufficient documentation

## 2014-09-17 DIAGNOSIS — O3431 Maternal care for cervical incompetence, first trimester: Secondary | ICD-10-CM | POA: Diagnosis not present

## 2014-09-17 DIAGNOSIS — Z87891 Personal history of nicotine dependence: Secondary | ICD-10-CM | POA: Diagnosis not present

## 2014-09-17 HISTORY — PX: CERVICAL CERCLAGE: SHX1329

## 2014-09-17 HISTORY — DX: Personal history of other medical treatment: Z92.89

## 2014-09-17 LAB — CBC
HEMATOCRIT: 30.7 % — AB (ref 36.0–46.0)
Hemoglobin: 10.3 g/dL — ABNORMAL LOW (ref 12.0–15.0)
MCH: 26.1 pg (ref 26.0–34.0)
MCHC: 33.6 g/dL (ref 30.0–36.0)
MCV: 77.7 fL — AB (ref 78.0–100.0)
Platelets: 250 10*3/uL (ref 150–400)
RBC: 3.95 MIL/uL (ref 3.87–5.11)
RDW: 17.3 % — AB (ref 11.5–15.5)
WBC: 8.4 10*3/uL (ref 4.0–10.5)

## 2014-09-17 SURGERY — CERCLAGE, CERVIX, VAGINAL APPROACH
Anesthesia: Spinal

## 2014-09-17 MED ORDER — ACETAMINOPHEN 650 MG RE SUPP
650.0000 mg | RECTAL | Status: DC | PRN
  Filled 2014-09-17: qty 1

## 2014-09-17 MED ORDER — ACETAMINOPHEN 325 MG PO TABS: 650.0000 mg | ORAL_TABLET | ORAL | Status: DC | PRN

## 2014-09-17 MED ORDER — LACTATED RINGERS IV SOLN
INTRAVENOUS | Status: DC
  Administered 2014-09-17 (×2): via INTRAVENOUS

## 2014-09-17 MED ORDER — MORPHINE SULFATE (PF) 4 MG/ML IV SOLN: 4.0000 mg | INTRAVENOUS | Status: DC | PRN

## 2014-09-17 MED ORDER — FENTANYL CITRATE (PF) 100 MCG/2ML IJ SOLN: 25.0000 ug | INTRAMUSCULAR | Status: DC | PRN

## 2014-09-17 MED ORDER — HYDROCODONE-ACETAMINOPHEN 7.5-300 MG PO TABS: 1.0000 | ORAL_TABLET | Freq: Four times a day (QID) | ORAL | Status: DC | PRN

## 2014-09-17 MED ORDER — HYDROCODONE BITARTRATE 10 MG PO C12A: 1.0000 | EXTENDED_RELEASE_CAPSULE | Freq: Four times a day (QID) | ORAL | Status: DC | PRN

## 2014-09-17 MED ORDER — SCOPOLAMINE 1 MG/3DAYS TD PT72: 1.0000 | MEDICATED_PATCH | Freq: Once | TRANSDERMAL | Status: DC

## 2014-09-17 MED ORDER — LIDOCAINE HCL (CARDIAC) 20 MG/ML IV SOLN
INTRAVENOUS | Status: AC
  Filled 2014-09-17: qty 5

## 2014-09-17 MED ORDER — ONDANSETRON HCL 4 MG/2ML IJ SOLN
INTRAMUSCULAR | Status: AC
  Filled 2014-09-17: qty 2

## 2014-09-17 MED ORDER — SODIUM CHLORIDE 0.9 % IJ SOLN: 3.0000 mL | INTRAMUSCULAR | Status: DC | PRN

## 2014-09-17 MED ORDER — SODIUM CHLORIDE 0.9 % IV SOLN: 250.0000 mL | INTRAVENOUS | Status: DC | PRN

## 2014-09-17 MED ORDER — SODIUM CHLORIDE 0.9 % IJ SOLN: 3.0000 mL | Freq: Two times a day (BID) | INTRAMUSCULAR | Status: DC

## 2014-09-17 MED ORDER — ACETAMINOPHEN 500 MG PO TABS: 1000.0000 mg | ORAL_TABLET | Freq: Once | ORAL | Status: DC

## 2014-09-17 MED ORDER — PROPOFOL 10 MG/ML IV BOLUS
INTRAVENOUS | Status: AC
  Filled 2014-09-17: qty 20

## 2014-09-17 MED ORDER — HYDROCODONE-ACETAMINOPHEN 7.5-325 MG PO TABS: 1.0000 | ORAL_TABLET | ORAL | Status: DC | PRN

## 2014-09-17 MED ORDER — ACETAMINOPHEN 160 MG/5ML PO SOLN
ORAL | Status: AC
  Administered 2014-09-17: 08:00:00
  Filled 2014-09-17: qty 40.6

## 2014-09-17 SURGICAL SUPPLY — 18 items
CLOTH BEACON ORANGE TIMEOUT ST (SAFETY) ×3 IMPLANT
COUNTER NEEDLE 1200 MAGNETIC (NEEDLE) ×3 IMPLANT
GLOVE BIO SURGEON STRL SZ7 (GLOVE) ×3 IMPLANT
GLOVE BIO SURGEON STRL SZ7.5 (GLOVE) ×3 IMPLANT
GLOVE BIO SURGEON STRL SZ8 (GLOVE) ×6 IMPLANT
GOWN STRL REUS W/TWL LRG LVL3 (GOWN DISPOSABLE) ×6 IMPLANT
NEEDLE MAYO .5 CIRCLE (NEEDLE) ×3 IMPLANT
NS IRRIG 1000ML POUR BTL (IV SOLUTION) ×3 IMPLANT
PACK VAGINAL MINOR WOMEN LF (CUSTOM PROCEDURE TRAY) ×3 IMPLANT
PAD OB MATERNITY 4.3X12.25 (PERSONAL CARE ITEMS) ×3 IMPLANT
PAD PREP 24X48 CUFFED NSTRL (MISCELLANEOUS) ×3 IMPLANT
SUT SILK 4 2X60 (SUTURE) ×3 IMPLANT
TOWEL OR 17X24 6PK STRL BLUE (TOWEL DISPOSABLE) ×6 IMPLANT
TRAY FOLEY CATH SILVER 14FR (SET/KITS/TRAYS/PACK) ×3 IMPLANT
TUBING NON-CON 1/4 X 20 CONN (TUBING) ×2 IMPLANT
TUBING NON-CON 1/4 X 20' CONN (TUBING) ×1
WATER STERILE IRR 1000ML POUR (IV SOLUTION) ×3 IMPLANT
YANKAUER SUCT BULB TIP NO VENT (SUCTIONS) ×3 IMPLANT

## 2014-09-17 NOTE — Anesthesia Procedure Notes (Signed)
Spinal Patient location during procedure: OR Preanesthetic Checklist Completed: patient identified, site marked, surgical consent, pre-op evaluation, timeout performed, IV checked, risks and benefits discussed and monitors and equipment checked Spinal Block Patient position: sitting Prep: DuraPrep Patient monitoring: heart rate, cardiac monitor, continuous pulse ox and blood pressure Approach: midline Location: L3-4 Injection technique: single-shot Needle Needle type: Sprotte  Needle gauge: 24 G Needle length: 9 cm Assessment Sensory level: T10 Additional Notes Spinal Dosage in OR  Bupivicaine ml       1.4 [id number]   mcg        100 Fentanyl mcg            25

## 2014-09-17 NOTE — Anesthesia Postprocedure Evaluation (Signed)
  Anesthesia Post-op Note  Patient: Tamara Green  Procedure(s) Performed: Procedure(s): CERCLAGE CERVICAL (N/A)  Patient is awake, responsive, moving her legs, and has signs of resolution of her numbness. Pain and nausea are reasonably well controlled. Vital signs are stable and clinically acceptable. Oxygen saturation is clinically acceptable. There are no apparent anesthetic complications at this time. Patient is ready for discharge.

## 2014-09-17 NOTE — Transfer of Care (Signed)
Immediate Anesthesia Transfer of Care Note  Patient: Tamara Green  Procedure(s) Performed: Procedure(s): CERCLAGE CERVICAL (N/A)  Patient Location: PACU  Anesthesia Type:Spinal  Level of Consciousness: awake, alert  and oriented  Airway & Oxygen Therapy: Patient Spontanous Breathing and Patient connected to nasal cannula oxygen  Post-op Assessment: Report given to RN and Post -op Vital signs reviewed and stable  Post vital signs: Reviewed and stable  Last Vitals:  Filed Vitals:   09/17/14 0802  BP: 134/79  Pulse: 86  Temp: 36.8 C  Resp: 16    Complications: No apparent anesthesia complications

## 2014-09-17 NOTE — Anesthesia Preprocedure Evaluation (Signed)
Anesthesia Evaluation  Patient identified by MRN, date of birth, ID band Patient awake    Reviewed: Allergy & Precautions, H&P , Patient's Chart, lab work & pertinent test results  Airway Mallampati: III  TM Distance: >3 FB Neck ROM: full    Dental no notable dental hx.    Pulmonary former smoker,    Pulmonary exam normal breath sounds clear to auscultation       Cardiovascular Exercise Tolerance: Good  Rhythm:regular Rate:Normal     Neuro/Psych    GI/Hepatic   Endo/Other    Renal/GU      Musculoskeletal   Abdominal   Peds  Hematology  (+) anemia ,   Anesthesia Other Findings   Reproductive/Obstetrics                             Anesthesia Physical Anesthesia Plan  ASA: III  Anesthesia Plan: Spinal   Post-op Pain Management:    Induction:   Airway Management Planned:   Additional Equipment:   Intra-op Plan:   Post-operative Plan:   Informed Consent: I have reviewed the patients History and Physical, chart, labs and discussed the procedure including the risks, benefits and alternatives for the proposed anesthesia with the patient or authorized representative who has indicated his/her understanding and acceptance.   Dental Advisory Given  Plan Discussed with: CRNA  Anesthesia Plan Comments: (Lab work confirmed with CRNA in room. Platelets okay. Discussed spinal anesthetic, and patient consents to the procedure:  included risk of possible headache,backache, failed block, allergic reaction, and nerve injury. This patient was asked if she had any questions or concerns before the procedure started. )        Anesthesia Quick Evaluation

## 2014-09-17 NOTE — Discharge Instructions (Addendum)
Cervical Cerclage Cervical cerclage is a surgical procedure for an incompetent cervix. An incompetent cervix is a weak cervix that opens up before labor begins. Cervical cerclage is a procedure in which the cervix is sewn closed during pregnancy.  LET [facility] CARE PROVIDER KNOW ABOUT:   Any allergies you have.  All medicines you are taking, including vitamins, herbs, eye drops, creams, and over-the-counter medicines.  Previous problems you or members of your family have had with the use of anesthetics.  Any blood disorders you have.  Previous surgeries you have had.  Medical conditions you have.  Any recent colds or infections. RISKS AND COMPLICATIONS  Generally, this is a safe procedure. However, as with any procedure, problems can occur. Possible problems include:  Infection.  Bleeding.  Rupturing the amniotic sac (membranes).  Going into early labor and delivery.  Problems with the anesthetics.  Infection of the amniotic sac. BEFORE THE PROCEDURE   Ask your health care provider about changing or stopping your medicines.  Do not eat or drink anything for 6-8 hours before the procedure.  Arrange for someone to drive you home after the procedure. PROCEDURE   An IV tube will be placed in your vein. You will be given a sedative to help you relax.  You will be given a medicine that makes you sleep through the procedure (general anesthetic) or a medicine injected into your spine that numbs your body below the waist (spinal or epidural anesthetic). You will be asleep or be numbed through the entire procedure.  A speculum will be placed in your vagina to visualize your cervix.  The cervix is then grasped and stitched closed tightly.  Ultrasound may be used to guide the procedure and monitor the baby. AFTER THE PROCEDURE      Nothing vaginal until given permission by your physician.    Call the doctor if you have any heavy bleeding, pass any clots, or full a gush  of fluid  Cervical Cerclage, Care After Refer to this sheet in the next few weeks. These instructions provide you with information on caring for yourself after your procedure. Your health care provider may also give you more specific instructions. Your treatment has been planned according to current medical practices, but problems sometimes occur. Call your health care provider if you have any problems or questions after your procedure. WHAT TO EXPECT AFTER THE PROCEDURE  After your procedure, it is typical to have the following:  Abdominal cramping.  Vaginal spotting. HOME CARE INSTRUCTIONS   Only take over-the-counter or prescription medicines for pain, discomfort, or fever as directed by your health care provider.  Avoid physical activities and exercise until your health care provider says it is okay.  Do not douche or have sexual intercourse until your health care provider tells you it is okay.  Keep your follow-up surgical and prenatal appointments with your health care provider. SEEK MEDICAL CARE IF:   You have abnormal vaginal discharge.  You have a rash.  You become lightheaded or feel faint.  You have abdominal pain that is not controlled with pain medicine. SEEK IMMEDIATE MEDICAL CARE IF:   You develop vaginal bleeding.  You are leaking fluid or have a gush of fluid from the vagina.  You have a fever.  You faint.  You have uterine contractions.  You feel your baby is not moving as much as usual, or you cannot feel your baby move.  You have chest pain or shortness of breath. Document Released:  10/15/2012 Document Revised: 12/30/2012 Document Reviewed: 10/15/2012 [facility] Patient Information 2015 [place], [place]. This information is not intended to replace advice given to you by your health care provider. Make sure you discuss any questions you have with your health care provider.

## 2014-09-17 NOTE — Op Note (Signed)
VAGINAL CERCLAGE PLACEMENT  SURGEON: Kista Robb A  PREOPERATIVE DIAGNOSIS: Singleton intrauterine pregnancy at [redacted]w[redacted]d. History of cervical incompetence.   POSTOPERATIVE DIAGNOSIS: Same  PROCEDURE PERFORMED: [facility] Cerclage Placement  ANESTHESIA: Spinal anesthesia  INTRAVENOUS FLUIDS:  1216ml  ESTIMATED BLOOD LOSS: 82ml  URINE OUTPUT:  {URINE AMOUNT/DESCRIPTION:[id number]  FINDINGS:  142ml  week size uterus.  COMPLICATIONS: None  INDICATIONS FOR THE PROCEDURE HISTORY: Tamara Green is a 34 y.o.  [id number] at [redacted]w[redacted]d gestation with a singleton live intrauterine pregnancy. The patient has a history of painless cervical dilation with 1 previous deliveries/ short cervix 1 cm documented on transvaginal ultrasound. The patient had screening for gonorrhea and chlamydia, which was negative.  SURGICAL RISKS: The patient was informed of the risks and benefits of the procedure. Risks included, but were not limited to, rupture of membranes. The patient was also counseled on the maternal risks of bleeding infection, injury to the cervix, vagina, bladder,or surrounding tissues. The patient expressed understanding of the risks Involved. All questions were answered and the patient consented to the procedure.   DESCRIPTION OF THE PROCEDURE: The patient was taken to the operating room where a time out was performed to confirm correct patient and correct procedure.  Spinal anesthesia was placed and found to be adequate.  The patient was then positioned on the operating table in the dorsal lithotomy position with the legs supported using stirrups.  The perineum was then prepped and draped in the usual sterile fashion.  A bimanual exam was performed and the uterus was noted to be 13 weeks size.  The cervix was open to 1 cm.  A straight catheter was inserted into the bladder and 100 ml of clear yellow urine was obtained.   OPERATIVE TECHNIQUE: A weighted speculum was inserted into the  vagina and the cervix was visualized.  The anterior lip of the cervix was grasped with a ring forceps.  A double #4 Silk suture on a medium Mayo needle was used to take a bite in the body of the cervix at the 12 o'clock position as close as possible to the junction of the rugated vaginal epithelium exiting at 9 o'clock.  Successive bites were taken 9 o'clock, 6 o'clock, 3 o'clock and 1 o'clock.   The pursestring suture was tightened and tied 8 times.  The knot was at the 12 o'clock position.  Adequate hemostasis was noted.  The vagina was irrigated.  All the instruments were removed.  The instrument counts were correct x 2.  The patient was taken to the PACU in stable condition.

## 2014-09-17 NOTE — H&P (Signed)
Tamara Green is a 34 y.o. female presenting for cervical cerclage for incompetent cervix. Maternal Medical History:  Prenatal complications: no prenatal complications Prenatal Complications - Diabetes: none.    OB History    Gravida Para Term Preterm AB TAB SAB Ectopic Multiple Living   6 3 2 1 2  2   3      Past Medical History  Diagnosis Date  . Anemia   . Incompetent cervix   . SVD (spontaneous vaginal delivery)     x 3  . Heartburn in pregnancy   . Rheumatoid arthritis(714.0)     knees  . History of blood transfusion 01/2014    [facility] - units 2 transfused   Past Surgical History  Procedure Laterality Date  . Cervical cerclage      x 3  . Breast biopsy      right   Family History: family history includes Diabetes in her mother. Social History:  reports that she quit smoking about 2 years ago. Her smoking use included Cigarettes. She has a 3 pack-year smoking history. She has never used smokeless tobacco. She reports that she does not drink alcohol or use illicit drugs.   Prenatal Transfer Tool  Maternal Diabetes: No Genetic Screening: Normal Maternal Ultrasounds/Referrals: Normal Fetal Ultrasounds or other Referrals:  Referred to Materal Fetal Medicine  Maternal Substance Abuse:  No Significant Maternal Medications:  None Significant Maternal Lab Results:  None Other Comments:  None  Review of Systems  All other systems reviewed and are negative.     Height 5\' 8"  (1.727 m), weight 269 lb (122.018 kg), last menstrual period 06/15/2014. Maternal Exam:  Abdomen: Patient reports no abdominal tenderness.   Physical Exam  Nursing note and vitals reviewed. Constitutional: She is oriented to person, place, and time. She appears well-developed and well-nourished.  HENT:  Head: Normocephalic and atraumatic.  Eyes: Conjunctivae are normal. Pupils are equal, round, and reactive to light.  Neck: Normal range of motion. Neck supple.  Cardiovascular: Normal  rate and regular rhythm.   Respiratory: Effort normal.  GI: Soft.  Genitourinary: Vagina normal and uterus normal.  Musculoskeletal: Normal range of motion.  Neurological: She is alert and oriented to person, place, and time.  Skin: Skin is warm and dry.  Psychiatric: She has a normal mood and affect. Her behavior is normal. Thought content normal.    Prenatal labs: ABO, Rh: --/--/O NEG (09/01 0845) Antibody: NEG (09/01 0845) Rubella: 0.44 (08/30 1725) RPR: NON REAC (08/30 1725)  HBsAg: NEGATIVE (08/30 1725)  HIV:    GBS:     Assessment/Plan: 13.3 weeks.  Incompetent cervix.  For cervical cerclage.   HARPER,CHARLES A 09/17/2014, 8:02 AM

## 2014-09-20 ENCOUNTER — Encounter (HOSPITAL_COMMUNITY): Payer: Self-pay | Admitting: Obstetrics

## 2014-09-21 ENCOUNTER — Ambulatory Visit (HOSPITAL_COMMUNITY): Payer: 59

## 2014-09-22 ENCOUNTER — Other Ambulatory Visit: Payer: Self-pay | Admitting: Certified Nurse Midwife

## 2014-09-22 ENCOUNTER — Telehealth (HOSPITAL_COMMUNITY): Payer: Self-pay | Admitting: Genetics

## 2014-09-22 ENCOUNTER — Other Ambulatory Visit (HOSPITAL_COMMUNITY): Payer: Self-pay | Admitting: Obstetrics

## 2014-09-22 DIAGNOSIS — O09522 Supervision of elderly multigravida, second trimester: Secondary | ICD-10-CM

## 2014-09-22 NOTE — Telephone Encounter (Signed)
Called Tamara Green to discuss her cell free fetal DNA test results.  Tamara Green had Panorama testing through [facility].  Testing was offered because of advanced maternal age.   The patient was identified by name and DOB.  We reviewed that these are within normal limits, showing a less than 1 in 10,000 risk for trisomies 21, 18 and 13, and monosomy X (Turner syndrome).  In addition, the risk for triploidy/vanishing twin and sex chromosome trisomies (47,XXX and 47,XXY) was also low risk.   We reviewed that this testing identifies > 99% of pregnancies with trisomy 50, trisomy 24, sex chromosome trisomies (47,XXX and 47,XXY), and triploidy. The detection rate for trisomy 18 is 96%.  The detection rate for monosomy X is ~92%.  The false positive rate is <0.1% for all conditions. Testing was also consistent with female fetal sex.  The patient did wish to know fetal sex.  She understands that this testing does not identify all genetic conditions.  All questions were answered to her satisfaction, she was encouraged to call with additional questions or concerns.  Cam Hai, MS Certified Genetic Counselor

## 2014-09-23 ENCOUNTER — Encounter: Payer: Self-pay | Admitting: *Deleted

## 2014-09-27 ENCOUNTER — Encounter: Payer: Self-pay | Admitting: *Deleted

## 2014-09-27 NOTE — Telephone Encounter (Signed)
[place] info in chart.

## 2014-09-28 NOTE — Telephone Encounter (Signed)
PA form has been faxed now that [facility] info available

## 2014-10-05 ENCOUNTER — Encounter (HOSPITAL_COMMUNITY): Payer: Self-pay

## 2014-10-05 ENCOUNTER — Encounter: Payer: Self-pay | Admitting: Obstetrics

## 2014-10-05 ENCOUNTER — Other Ambulatory Visit (HOSPITAL_COMMUNITY): Payer: Self-pay | Admitting: Maternal and Fetal Medicine

## 2014-10-05 ENCOUNTER — Ambulatory Visit (HOSPITAL_COMMUNITY)
Admission: RE | Admit: 2014-10-05 | Discharge: 2014-10-05 | Disposition: A | Discharge status: Home / Self Care | Payer: Medicaid Other | Source: Ambulatory Visit | Attending: Obstetrics | Admitting: Obstetrics

## 2014-10-05 ENCOUNTER — Ambulatory Visit (INDEPENDENT_AMBULATORY_CARE_PROVIDER_SITE_OTHER): Payer: Medicaid Other | Admitting: Obstetrics

## 2014-10-05 VITALS — BP 116/79 | HR 104 | Temp 99.2°F

## 2014-10-05 DIAGNOSIS — O3431 Maternal care for cervical incompetence, first trimester: Secondary | ICD-10-CM

## 2014-10-05 DIAGNOSIS — O3432 Maternal care for cervical incompetence, second trimester: Secondary | ICD-10-CM | POA: Diagnosis not present

## 2014-10-05 DIAGNOSIS — Z3A16 16 weeks gestation of pregnancy: Secondary | ICD-10-CM

## 2014-10-05 DIAGNOSIS — O09522 Supervision of elderly multigravida, second trimester: Secondary | ICD-10-CM | POA: Diagnosis present

## 2014-10-05 DIAGNOSIS — O09292 Supervision of pregnancy with other poor reproductive or obstetric history, second trimester: Secondary | ICD-10-CM

## 2014-10-05 DIAGNOSIS — Z9889 Other specified postprocedural states: Secondary | ICD-10-CM

## 2014-10-05 DIAGNOSIS — Z3482 Encounter for supervision of other normal pregnancy, second trimester: Secondary | ICD-10-CM

## 2014-10-05 LAB — POCT URINALYSIS DIPSTICK
Bilirubin, UA: NEGATIVE
Blood, UA: NEGATIVE
GLUCOSE UA: 100
Ketones, UA: NEGATIVE
LEUKOCYTES UA: NEGATIVE
Nitrite, UA: NEGATIVE
PROTEIN UA: NEGATIVE
SPEC GRAV UA: 1.015
UROBILINOGEN UA: NEGATIVE
pH, UA: 6

## 2014-10-05 MED ORDER — HYDROXYPROGESTERONE CAPROATE 250 MG/ML IM OIL
250.0000 mg | TOPICAL_OIL | INTRAMUSCULAR | Status: DC
  Administered 2014-10-05 – 2014-12-06 (×10): 250 mg via INTRAMUSCULAR

## 2014-10-05 NOTE — Progress Notes (Signed)
  Subjective:    Tamara Green is a 34 y.o. female being seen today for her obstetrical visit. She is at [redacted]w[redacted]d gestation. Patient reports: no complaints.  Problem List Items Addressed This Visit    None    Visit Diagnoses    Encounter for supervision of other normal pregnancy in second trimester    -  Primary    Relevant Medications    hydroxyprogesterone caproate (DELALUTIN) 250 mg/mL injection 250 mg    Other Relevant Orders    POCT urinalysis dipstick (Completed)      Patient Active Problem List   Diagnosis Date Noted  . Advanced maternal age in multigravida 09/14/2014  . Symptomatic anemia 02/03/2014  . Rheumatoid arthritis 02/03/2014  . Anemia 02/03/2014    Objective:     BP 116/79 mmHg  Pulse 104  Temp(Src) 99.2 F (37.3 C)  LMP 06/15/2014 Uterine Size: Below umbilicus     Assessment:    Pregnancy @ [redacted]w[redacted]d  weeks Doing well    Plan:    Problem list reviewed and updated. Labs reviewed.  Follow up in 1 weeks. FIRST/CF mutation testing/NIPT/QUAD SCREEN/fragile X/Ashkenazi Jewish population testing/Spinal muscular atrophy discussed: Results discussed Role of ultrasound in pregnancy discussed; fetal survey: results reviewed. Amniocentesis discussed: declined

## 2014-10-05 NOTE — ED Notes (Signed)
Pt reports low back pain x 2 days.

## 2014-10-12 ENCOUNTER — Ambulatory Visit (INDEPENDENT_AMBULATORY_CARE_PROVIDER_SITE_OTHER): Payer: Medicaid Other | Admitting: Obstetrics

## 2014-10-12 VITALS — BP 117/74 | HR 97 | Temp 98.3°F | Wt 261.0 lb

## 2014-10-12 DIAGNOSIS — O09522 Supervision of elderly multigravida, second trimester: Secondary | ICD-10-CM

## 2014-10-12 DIAGNOSIS — O3431 Maternal care for cervical incompetence, first trimester: Secondary | ICD-10-CM

## 2014-10-12 NOTE — Progress Notes (Signed)
Nursing vist only for 17 OH-P.

## 2014-10-13 ENCOUNTER — Encounter (HOSPITAL_COMMUNITY): Payer: Self-pay | Admitting: *Deleted

## 2014-10-13 ENCOUNTER — Inpatient Hospital Stay (HOSPITAL_COMMUNITY)
Admission: AD | Admit: 2014-10-13 | Discharge: 2014-10-13 | Disposition: A | Discharge status: Home / Self Care | Payer: Medicaid Other | Source: Ambulatory Visit | Attending: Obstetrics | Admitting: Obstetrics

## 2014-10-13 DIAGNOSIS — L259 Unspecified contact dermatitis, unspecified cause: Secondary | ICD-10-CM | POA: Insufficient documentation

## 2014-10-13 DIAGNOSIS — S90562A Insect bite (nonvenomous), left ankle, initial encounter: Secondary | ICD-10-CM

## 2014-10-13 DIAGNOSIS — S90862A Insect bite (nonvenomous), left foot, initial encounter: Secondary | ICD-10-CM

## 2014-10-13 DIAGNOSIS — O26892 Other specified pregnancy related conditions, second trimester: Secondary | ICD-10-CM | POA: Diagnosis not present

## 2014-10-13 DIAGNOSIS — W57XXXA Bitten or stung by nonvenomous insect and other nonvenomous arthropods, initial encounter: Secondary | ICD-10-CM | POA: Insufficient documentation

## 2014-10-13 DIAGNOSIS — Z3A17 17 weeks gestation of pregnancy: Secondary | ICD-10-CM | POA: Insufficient documentation

## 2014-10-13 DIAGNOSIS — Z87891 Personal history of nicotine dependence: Secondary | ICD-10-CM | POA: Diagnosis not present

## 2014-10-13 DIAGNOSIS — S70362A Insect bite (nonvenomous), left thigh, initial encounter: Secondary | ICD-10-CM | POA: Diagnosis not present

## 2014-10-13 DIAGNOSIS — R21 Rash and other nonspecific skin eruption: Secondary | ICD-10-CM | POA: Diagnosis present

## 2014-10-13 LAB — URINALYSIS, ROUTINE W REFLEX MICROSCOPIC
BILIRUBIN URINE: NEGATIVE
Glucose, UA: NEGATIVE mg/dL
HGB URINE DIPSTICK: NEGATIVE
Ketones, ur: NEGATIVE mg/dL
Leukocytes, UA: NEGATIVE
Nitrite: NEGATIVE
PROTEIN: 100 mg/dL — AB
Specific Gravity, Urine: >1.03 — ABNORMAL HIGH (ref 1.005–1.030)
UROBILINOGEN UA: 1 mg/dL (ref 0.0–1.0)
pH: 6 (ref 5.0–8.0)

## 2014-10-13 LAB — URINE MICROSCOPIC-ADD ON

## 2014-10-13 MED ORDER — DIPHENHYDRAMINE HCL 25 MG PO CAPS
25.0000 mg | ORAL_CAPSULE | Freq: Once | ORAL | Status: AC
  Administered 2014-10-13: 25 mg via ORAL
  Filled 2014-10-13: qty 1

## 2014-10-13 MED ORDER — HYDROCORTISONE 1 % EX CREA
TOPICAL_CREAM | Freq: Three times a day (TID) | CUTANEOUS | Status: DC
  Administered 2014-10-13: 12:00:00 via TOPICAL
  Filled 2014-10-13: qty 28

## 2014-10-13 MED ORDER — HYDROCORTISONE 1 % EX CREA: TOPICAL_CREAM | Freq: Three times a day (TID) | CUTANEOUS | Status: DC

## 2014-10-13 NOTE — MAU Provider Note (Signed)
Chief Complaint: Allergic Reaction   First Provider Initiated Contact with Patient 10/13/14 1051      SUBJECTIVE HPI: Tamara Green is a 34 y.o. [id number] at [redacted]w[redacted]d by LMP who presents to maternity admissions reporting allergic reaction with rash.  She reports ant bites on her left foot/ankle and another possible bite on her left thigh as well as a large rash on her buttock above the injection site for her 17-P she received yesterday.  This is her second dose of 17-P in the pregnancy and she received the first dose without complication.  She noticed the bites and rash yesterday at work and both are itching and worse today.   She denies abdominal pain, vaginal bleeding, vaginal itching/burning, urinary symptoms, h/a, dizziness, n/v, or fever/chills.     Allergic Reaction This is a new problem. The current episode started yesterday. The problem occurs constantly. The problem has been gradually worsening since onset. The problem is mild. The patient was exposed to insect bite and a prescription drug. The time of exposure was separated in time from the onset of symptoms. The exposure occurred at work. Associated symptoms include itching and a rash. Pertinent negatives include no abdominal pain, chest pain, chest pressure, diarrhea, difficulty breathing, eye itching, eye watering, trouble swallowing or vomiting. There is no swelling present. Past treatments include nothing. There is no history of atopic dermatitis or medication allergies.    Past Medical History  Diagnosis Date  . Anemia   . Incompetent cervix   . SVD (spontaneous vaginal delivery)     x 3  . Heartburn in pregnancy   . Rheumatoid arthritis(714.0)     knees  . History of blood transfusion 01/2014    [facility] - units 2 transfused   Past Surgical History  Procedure Laterality Date  . Cervical cerclage      x 3  . Breast biopsy      right  . Cervical cerclage N/A 09/17/2014    Procedure: CERCLAGE CERVICAL;  Surgeon: Shelly Bombard, MD;  Location: [facility];  Service: Gynecology;  Laterality: N/A;   Social History   Social History  . Marital Status: Married    Spouse Name: N/A  . Number of Children: N/A  . Years of Education: N/A   Occupational History  . Not on file.   Social History Main Topics  . Smoking status: Former Smoker -- 0.50 packs/day for 6 years    Types: Cigarettes    Quit date: 01/09/2012  . Smokeless tobacco: Never Used  . Alcohol Use: No  . Drug Use: No  . Sexual Activity: Yes    Birth Control/ Protection: None     Comment: approx [redacted] wks gestation   Other Topics Concern  . Not on file   Social History Narrative   Current Facility-Administered Medications on File Prior to Encounter  Medication Dose Route Frequency Provider Last Rate Last Dose  . hydroxyprogesterone caproate (DELALUTIN) 250 mg/mL injection 250 mg  250 mg Intramuscular Weekly Shelly Bombard, MD   250 mg at 10/12/14 1031   Current Outpatient Prescriptions on File Prior to Encounter  Medication Sig Dispense Refill  . Doxylamine-Pyridoxine (DICLEGIS) 10-10 MG TBEC 1 tab in AM, 1 tab mid afternoon 2 tabs at bedtime. Max dose 4 tabs daily. (Patient taking differently: Take 2 tablets by mouth at bedtime as needed (for nausea). ) 100 tablet 5  . ferrous sulfate 325 (65 FE) MG tablet Take 325 mg by mouth daily with breakfast.    .  Prenatal Vit-Fe Fumarate-FA (PRENATAL MULTIVITAMIN) TABS tablet Take 1 tablet by mouth daily at 12 noon.     Allergies  Allergen Reactions  . Percocet [Oxycodone-Acetaminophen] Hives    Pt has tolerated vicodin in the past.    ROS:  Review of Systems  Constitutional: Negative for fever, chills and fatigue.  HENT: Negative for sinus pressure and trouble swallowing.   Eyes: Negative for photophobia and itching.  Respiratory: Negative for shortness of breath.   Cardiovascular: Negative for chest pain.  Gastrointestinal: Negative for nausea, vomiting, abdominal pain, diarrhea and  constipation.  Genitourinary: Negative for dysuria, frequency, flank pain, vaginal bleeding, vaginal discharge, difficulty urinating, vaginal pain and pelvic pain.  Musculoskeletal: Negative for neck pain.  Skin: Positive for itching and rash.  Neurological: Negative for dizziness, weakness and headaches.  Psychiatric/Behavioral: Negative.      I have reviewed patient's Past Medical Hx, Surgical Hx, Family Hx, Social Hx, medications and allergies.   Physical Exam   Patient Vitals for the past 24 hrs:  BP Temp Temp src Pulse Resp SpO2 Height Weight  10/13/14 1300 117/63 mmHg - - 77 18 - - -  10/13/14 1027 120/71 mmHg 98.3 F (36.8 C) Oral 95 18 99 % 5\' 8"  (1.727 m) 117.845 kg (259 lb 12.8 oz)   Constitutional: Well-developed, well-nourished female in no acute distress.  Cardiovascular: normal rate Respiratory: normal effort GI: Abd soft, non-tender. Pos BS x 4 MS: Extremities nontender, no edema, normal ROM Skin: Raised, round, smooth lesions on left ankle and thigh consistent with  Neurologic: Alert and oriented x 4.  GU: Neg CVAT.  FHT 156 by doppler  MAU Management/MDM: Treatments in MAU included Benadryl 25 mg PO and hydrocortisone cream topically.  Pt reports improvement itching after both medications.  Pt stable, to follow up with Dr Jodi Mourning regarding next week's 17-P dose.  Reaction consistent with insect bites and contact dermatitis, no evidence of systemic reaction/hives so unlikely related to medication.  Pt stable at time of discharge.  ASSESSMENT 1. Insect bite   2. Contact dermatitis     PLAN Discharge home Benadryl PRN, topical hydrocortisone PRN itching F/U with Dr Jodi Mourning    Medication List    STOP taking these medications        Hydrocodone-Acetaminophen 7.5-300 MG Tabs      TAKE these medications        Doxylamine-Pyridoxine 10-10 MG Tbec  Commonly known as:  DICLEGIS  1 tab in AM, 1 tab mid afternoon 2 tabs at bedtime. Max dose 4 tabs daily.      ferrous sulfate 325 (65 FE) MG tablet  Take 325 mg by mouth daily with breakfast.     hydrocortisone cream 1 %  Apply topically 3 (three) times daily.     prenatal multivitamin Tabs tablet  Take 1 tablet by mouth daily at 12 noon.       Follow-up Information    Follow up with HARPER,CHARLES A, MD.   Specialty:  Obstetrics and Gynecology   Why:  As scheduled   Contact information:   [address] [place] [address] [phone]       Follow up with [facility].   Why:  As needed for emergencies   Contact information:   [address] [id number] [place] Certified Nurse-Midwife 10/13/2014  6:52 PM

## 2014-10-13 NOTE — Discharge Instructions (Signed)
Contact Dermatitis Dermatitis is redness, soreness, and swelling (inflammation) of the skin. Contact dermatitis is a reaction to certain substances that touch the skin. There are two types of contact dermatitis:   Irritant contact dermatitis. This type is caused by something that irritates your skin, such as dry hands from washing them too much. This type does not require previous exposure to the substance for a reaction to occur. This type is more common.  Allergic contact dermatitis. This type is caused by a substance that you are allergic to, such as a nickel allergy or poison ivy. This type only occurs if you have been exposed to the substance (allergen) before. Upon a repeat exposure, your body reacts to the substance. This type is less common. CAUSES  Many different substances can cause contact dermatitis. Irritant contact dermatitis is most commonly caused by exposure to:   Makeup.   Soaps.   Detergents.   Bleaches.   Acids.   Metal salts, such as nickel.  Allergic contact dermatitis is most commonly caused by exposure to:   Poisonous plants.   Chemicals.   Jewelry.   Latex.   Medicines.   Preservatives in products, such as clothing.  RISK FACTORS This condition is more likely to develop in:   People who have jobs that expose them to irritants or allergens.  People who have certain medical conditions, such as asthma or eczema.  SYMPTOMS  Symptoms of this condition may occur anywhere on your body where the irritant has touched you or is touched by you. Symptoms include:  Dryness or flaking.   Redness.   Cracks.   Itching.   Pain or a burning feeling.   Blisters.  Drainage of small amounts of blood or clear fluid from skin cracks. With allergic contact dermatitis, there may also be swelling in areas such as the eyelids, mouth, or genitals.  DIAGNOSIS  This condition is diagnosed with a medical history and physical exam. A patch skin test  may be performed to help determine the cause. If the condition is related to your job, you may need to see an occupational medicine specialist. TREATMENT Treatment for this condition includes figuring out what caused the reaction and protecting your skin from further contact. Treatment may also include:   Steroid creams or ointments. Oral steroid medicines may be needed in more severe cases.  Antibiotics or antibacterial ointments, if a skin infection is present.  Antihistamine lotion or an antihistamine taken by mouth to ease itching.  A bandage (dressing). HOME CARE INSTRUCTIONS Skin Care  Moisturize your skin as needed.   Apply cool compresses to the affected areas.  Try taking a bath with:  Epsom salts. Follow the instructions on the packaging. You can get these at your local pharmacy or grocery store.  Baking soda. Pour a small amount into the bath as directed by your health care provider.  Colloidal oatmeal. Follow the instructions on the packaging. You can get this at your local pharmacy or grocery store.  Try applying baking soda paste to your skin. Stir water into baking soda until it reaches a paste-like consistency.  Do not scratch your skin.  Bathe less frequently, such as every other day.  Bathe in lukewarm water. Avoid using hot water. Medicines  Take or apply over-the-counter and prescription medicines only as told by your health care provider.   If you were prescribed an antibiotic medicine, take or apply your antibiotic as told by your health care provider. Do not stop using the  antibiotic even if your condition starts to improve. General Instructions  Keep all follow-up visits as told by your health care provider. This is important.  Avoid the substance that caused your reaction. If you do not know what caused it, keep a journal to try to track what caused it. Write down:  What you eat.  What cosmetic products you use.  What you drink.  What  you wear in the affected area. This includes jewelry.  If you were given a dressing, take care of it as told by your health care provider. This includes when to change and remove it. SEEK MEDICAL CARE IF:   Your condition does not improve with treatment.  Your condition gets worse.  You have signs of infection such as swelling, tenderness, redness, soreness, or warmth in the affected area.  You have a fever.  You have new symptoms. SEEK IMMEDIATE MEDICAL CARE IF:   You have a severe headache, neck pain, or neck stiffness.  You vomit.  You feel very sleepy.  You notice red streaks coming from the affected area.  Your bone or joint underneath the affected area becomes painful after the skin has healed.  The affected area turns darker.  You have difficulty breathing.   This information is not intended to replace advice given to you by your health care provider. Make sure you discuss any questions you have with your health care provider.   Document Released: 12/23/1999 Document Revised: 09/15/2014 Document Reviewed: 05/12/2014 Elsevier Interactive Patient Education 2016 [place], flies, fleas, bedbugs, and many other insects can bite. Insect bites are different from insect stings. A sting is when poison (venom) is injected into the skin. Insect bites can cause pain or itching for a few days, but they are usually not serious. Some insects can spread diseases to people through a bite. SYMPTOMS  Symptoms of an insect bite include:  Itching or pain in the bite area.  Redness and swelling in the bite area.  An open wound (skin ulcer). In many cases, symptoms last for 2-4 days.  DIAGNOSIS  This condition is usually diagnosed based on symptoms and a physical exam. TREATMENT  Treatment is usually not needed for an insect bite. Symptoms often go away on their own. Your health care provider may recommend creams or lotions to help reduce itching.  Antibiotic medicines may be prescribed if the bite becomes infected. A tetanus shot may be given in some cases. If you develop an allergic reaction to an insect bite, your health care provider will prescribe medicines to treat the reaction (antihistamines). This is rare. HOME CARE INSTRUCTIONS  Do not scratch the bite area.  Keep the bite area clean and dry. Wash the bite area daily with soap and water as told by your health care provider.  If directed, applyice to the bite area.  Put ice in a plastic bag.  Place a towel between your skin and the bag.  Leave the ice on for 20 minutes, 2-3 times per day.  To help reduce itching and swelling, try applying a baking soda paste, cortisone cream, or calamine lotion to the bite area as told by your health care provider.  Apply or take over-the-counter and prescription medicines only as told by your health care provider.  If you were prescribed an antibiotic medicine, use it as told by your health care provider. Do not stop using the antibiotic even if your condition improves.  Keep all follow-up visits as told  by your health care provider. This is important. PREVENTION   Use insect repellent. The best insect repellents contain:  DEET, picaridin, oil of lemon eucalyptus (OLE), or IR3535.  Higher amounts of an active ingredient.  When you are outdoors, wear clothing that covers your arms and legs.  Avoid opening windows that do not have window screens. SEEK MEDICAL CARE IF:  You have increased redness, swelling, or pain in the bite area.  You have a fever. SEEK IMMEDIATE MEDICAL CARE IF:   You have joint pain.   You have fluid, blood, or pus coming from the bite area.  You have a headache or neck pain.  You have unusual weakness.  You have a rash.  You have chest pain or shortness of breath.  You have abdominal pain, nausea, or vomiting.  You feel unusually tired or sleepy.   This information is not intended to  replace advice given to you by your health care provider. Make sure you discuss any questions you have with your health care provider.   Document Released: 02/02/2004 Document Revised: 09/15/2014 Document Reviewed: 05/12/2014 Elsevier Interactive Patient Education [employer].

## 2014-10-13 NOTE — MAU Note (Signed)
Patient states she noticed some itchy welts on her left thigh and on her buttocks/back.  She had the [facility] shot yesterday but has had it before without reaction.  Patient denies vaginal bleeding or LOF.  Also c/o headache that started this morning.  Patient tried to take tylenol, but it did not help.

## 2014-10-18 ENCOUNTER — Telehealth: Payer: Self-pay | Admitting: *Deleted

## 2014-10-18 NOTE — Telephone Encounter (Signed)
Pt called to office regarding her Diclegis Rx.  Call placed to pharmacy in order to fill Rx as pt has not yet picked up.  Pharmacy does not have Medicaid information on pt. Call placed to pt making her aware that pharmacy needs Medicaid information in order to process and fill Rx.

## 2014-10-19 ENCOUNTER — Ambulatory Visit (INDEPENDENT_AMBULATORY_CARE_PROVIDER_SITE_OTHER): Payer: Medicaid Other | Admitting: *Deleted

## 2014-10-19 ENCOUNTER — Ambulatory Visit (HOSPITAL_COMMUNITY): Payer: 59

## 2014-10-19 ENCOUNTER — Ambulatory Visit: Payer: Medicaid Other

## 2014-10-19 VITALS — BP 135/81 | HR 96 | Temp 98.6°F | Wt 263.0 lb

## 2014-10-19 DIAGNOSIS — O09212 Supervision of pregnancy with history of pre-term labor, second trimester: Secondary | ICD-10-CM

## 2014-10-19 DIAGNOSIS — O09892 Supervision of other high risk pregnancies, second trimester: Secondary | ICD-10-CM

## 2014-10-19 NOTE — Progress Notes (Signed)
Patient in office today for a 17-P injection. Patient is schedule to come back next week for her next injection.   BP 135/81 mmHg  Pulse 96  Temp(Src) 98.6 F (37 C)  Wt 263 lb (119.296 kg)  LMP 06/15/2014  Administrations This Visit    hydroxyprogesterone caproate (DELALUTIN) 250 mg/mL injection 250 mg    Admin Date Action Dose Route Administered By         10/19/2014 Given 250 mg Intramuscular Carole Binning, LPN

## 2014-10-22 ENCOUNTER — Ambulatory Visit (HOSPITAL_COMMUNITY)
Admission: RE | Admit: 2014-10-22 | Discharge: 2014-10-22 | Disposition: A | Discharge status: Home / Self Care | Payer: Medicaid Other | Source: Ambulatory Visit | Attending: Maternal and Fetal Medicine | Admitting: Maternal and Fetal Medicine

## 2014-10-22 ENCOUNTER — Other Ambulatory Visit (HOSPITAL_COMMUNITY): Payer: Self-pay | Admitting: Maternal and Fetal Medicine

## 2014-10-22 ENCOUNTER — Encounter (HOSPITAL_COMMUNITY): Payer: Self-pay

## 2014-10-22 DIAGNOSIS — O09522 Supervision of elderly multigravida, second trimester: Secondary | ICD-10-CM

## 2014-10-22 DIAGNOSIS — O26872 Cervical shortening, second trimester: Secondary | ICD-10-CM | POA: Diagnosis not present

## 2014-10-22 DIAGNOSIS — Z3A00 Weeks of gestation of pregnancy not specified: Secondary | ICD-10-CM | POA: Insufficient documentation

## 2014-10-26 ENCOUNTER — Ambulatory Visit (INDEPENDENT_AMBULATORY_CARE_PROVIDER_SITE_OTHER): Payer: Medicaid Other | Admitting: *Deleted

## 2014-10-26 VITALS — BP 121/78 | HR 95 | Wt 260.0 lb

## 2014-10-26 DIAGNOSIS — O09213 Supervision of pregnancy with history of pre-term labor, third trimester: Secondary | ICD-10-CM

## 2014-10-26 DIAGNOSIS — Z3482 Encounter for supervision of other normal pregnancy, second trimester: Secondary | ICD-10-CM

## 2014-10-26 MED ORDER — PRENATE PIXIE 10-0.6-0.4-200 MG PO CAPS: 1.0000 | ORAL_CAPSULE | Freq: Every day | ORAL | Status: DC

## 2014-10-26 NOTE — Progress Notes (Signed)
Pt is in office today for 17p injection. Pt states that she is doing well and has no other concerns. Pt tolerated injection well.    BP 121/78 mmHg  Pulse 95  Wt 260 lb (117.935 kg)  LMP 06/15/2014'  Administrations This Visit    hydroxyprogesterone caproate (DELALUTIN) 250 mg/mL injection 250 mg    Admin Date Action Dose Route Administered By         10/26/2014 Given 250 mg Intramuscular Valene Bors, CMA

## 2014-11-02 ENCOUNTER — Ambulatory Visit (INDEPENDENT_AMBULATORY_CARE_PROVIDER_SITE_OTHER): Payer: Medicaid Other | Admitting: *Deleted

## 2014-11-02 VITALS — BP 120/77 | HR 92 | Temp 98.6°F | Wt 264.0 lb

## 2014-11-02 DIAGNOSIS — O09213 Supervision of pregnancy with history of pre-term labor, third trimester: Secondary | ICD-10-CM

## 2014-11-02 DIAGNOSIS — Z8751 Personal history of pre-term labor: Secondary | ICD-10-CM

## 2014-11-02 NOTE — Progress Notes (Signed)
Patient in office today for a 17-P injection. Patient is scheduled to return in 1 week for next injection. Patient tolerated injection well.  BP 120/77 mmHg  Pulse 92  Temp(Src) 98.6 F (37 C)  Wt 264 lb (119.75 kg)  LMP 06/15/2014  Administrations This Visit    hydroxyprogesterone caproate (DELALUTIN) 250 mg/mL injection 250 mg    Admin Date Action Dose Route Administered By         11/02/2014 Given 250 mg Intramuscular Carole Binning, LPN

## 2014-11-08 ENCOUNTER — Ambulatory Visit (INDEPENDENT_AMBULATORY_CARE_PROVIDER_SITE_OTHER): Payer: Medicaid Other | Admitting: Obstetrics

## 2014-11-08 ENCOUNTER — Encounter: Payer: Self-pay | Admitting: Obstetrics

## 2014-11-08 VITALS — BP 108/67 | HR 97 | Temp 98.4°F | Wt 262.0 lb

## 2014-11-08 DIAGNOSIS — O3432 Maternal care for cervical incompetence, second trimester: Secondary | ICD-10-CM

## 2014-11-08 DIAGNOSIS — O0992 Supervision of high risk pregnancy, unspecified, second trimester: Secondary | ICD-10-CM

## 2014-11-08 DIAGNOSIS — Z331 Pregnant state, incidental: Secondary | ICD-10-CM

## 2014-11-08 DIAGNOSIS — Z8751 Personal history of pre-term labor: Secondary | ICD-10-CM

## 2014-11-08 DIAGNOSIS — O09213 Supervision of pregnancy with history of pre-term labor, third trimester: Secondary | ICD-10-CM

## 2014-11-08 DIAGNOSIS — O09522 Supervision of elderly multigravida, second trimester: Secondary | ICD-10-CM

## 2014-11-08 DIAGNOSIS — Z1389 Encounter for screening for other disorder: Secondary | ICD-10-CM

## 2014-11-08 NOTE — Progress Notes (Signed)
Subjective:    Tamara Green is a 34 y.o. female being seen today for her obstetrical visit. She is at [redacted]w[redacted]d gestation. Patient reports: Pressure, cramping, discharge and ? leaking . Fetal movement: normal.  Has cerclage in place.  Problem List Items Addressed This Visit    None    Visit Diagnoses    Supervision of high risk pregnancy in second trimester    -  Primary    Relevant Orders    POCT urinalysis dipstick    SureSwab Bacterial Vaginosis/itis      Patient Active Problem List   Diagnosis Date Noted  . Advanced maternal age in multigravida 09/14/2014  . Symptomatic anemia 02/03/2014  . Rheumatoid arthritis ([place]) 02/03/2014  . Anemia 02/03/2014   Objective:    BP 108/67 mmHg  Pulse 97  Temp(Src) 98.4 F (36.9 C)  Wt 262 lb (118.842 kg)  LMP 06/15/2014 FHT: 150 BPM  Uterine Size: size equals dates     Speculum exam:  No pooling of fluid.  Cerclage visible and intact.  Cervix appears long and closed.  Assessment:    Pregnancy @ [redacted]w[redacted]d    Plan:    Signs and symptoms of preterm labor: discussed.  Labs, problem list reviewed and updated 2 hr GTT planned Follow up in 1 week.

## 2014-11-12 LAB — SURESWAB BACTERIAL VAGINOSIS/ITIS
Atopobium vaginae: NOT DETECTED Log (cells/mL)
BV CATEGORY: UNDETERMINED — AB
C. GLABRATA, DNA: NOT DETECTED
C. PARAPSILOSIS, DNA: NOT DETECTED
C. TROPICALIS, DNA: NOT DETECTED
C. albicans, DNA: NOT DETECTED
GARDNERELLA VAGINALIS: 7.1 Log (cells/mL)
LACTOBACILLUS SPECIES: 7.2 Log (cells/mL)
MEGASPHAERA SPECIES: DETECTED Log (cells/mL)
T. VAGINALIS RNA, QL TMA: NOT DETECTED

## 2014-11-13 ENCOUNTER — Other Ambulatory Visit: Payer: Self-pay | Admitting: Obstetrics

## 2014-11-13 DIAGNOSIS — N760 Acute vaginitis: Principal | ICD-10-CM

## 2014-11-13 DIAGNOSIS — B9689 Other specified bacterial agents as the cause of diseases classified elsewhere: Secondary | ICD-10-CM

## 2014-11-13 MED ORDER — METRONIDAZOLE 500 MG PO TABS: 500.0000 mg | ORAL_TABLET | Freq: Two times a day (BID) | ORAL | Status: DC

## 2014-11-16 ENCOUNTER — Encounter: Payer: Self-pay | Admitting: Obstetrics

## 2014-11-16 ENCOUNTER — Ambulatory Visit (INDEPENDENT_AMBULATORY_CARE_PROVIDER_SITE_OTHER): Payer: Medicaid Other | Admitting: Obstetrics

## 2014-11-16 VITALS — BP 129/75 | HR 87 | Temp 98.8°F | Wt 263.8 lb

## 2014-11-16 DIAGNOSIS — O09213 Supervision of pregnancy with history of pre-term labor, third trimester: Secondary | ICD-10-CM

## 2014-11-16 DIAGNOSIS — O0992 Supervision of high risk pregnancy, unspecified, second trimester: Secondary | ICD-10-CM

## 2014-11-16 DIAGNOSIS — Z8751 Personal history of pre-term labor: Secondary | ICD-10-CM

## 2014-11-16 DIAGNOSIS — O3432 Maternal care for cervical incompetence, second trimester: Secondary | ICD-10-CM

## 2014-11-16 DIAGNOSIS — O09522 Supervision of elderly multigravida, second trimester: Secondary | ICD-10-CM

## 2014-11-16 LAB — POCT URINALYSIS DIPSTICK
Bilirubin, UA: NEGATIVE
Blood, UA: NEGATIVE
Glucose, UA: NEGATIVE
Ketones, UA: NEGATIVE
Leukocytes, UA: NEGATIVE
Nitrite, UA: NEGATIVE
Spec Grav, UA: 1.01
Urobilinogen, UA: NEGATIVE
pH, UA: 6

## 2014-11-16 NOTE — Progress Notes (Signed)
Subjective:    Tamara Green is a 34 y.o. female being seen today for her obstetrical visit. She is at [redacted]w[redacted]d gestation. Patient reports: no complaints . Fetal movement: normal.  Problem List Items Addressed This Visit    None     Patient Active Problem List   Diagnosis Date Noted  . Advanced maternal age in multigravida 09/14/2014  . Symptomatic anemia 02/03/2014  . Rheumatoid arthritis ([place]) 02/03/2014  . Anemia 02/03/2014   Objective:    BP 129/75 mmHg  Pulse 87  Temp(Src) 98.8 F (37.1 C)  Wt 263 lb 12.8 oz (119.659 kg)  LMP 06/15/2014 FHT: 150 BPM  Uterine Size: size equals dates     Assessment:    Pregnancy @ [redacted]w[redacted]d    Plan:    OBGCT: discussed. Signs and symptoms of preterm labor: discussed.  Labs, problem list reviewed and updated 2 hr GTT planned Follow up in 1 weeks.

## 2014-11-16 NOTE — Progress Notes (Signed)
Patient is doing well today.  

## 2014-11-22 ENCOUNTER — Ambulatory Visit (INDEPENDENT_AMBULATORY_CARE_PROVIDER_SITE_OTHER): Payer: Medicaid Other | Admitting: Obstetrics

## 2014-11-22 ENCOUNTER — Encounter: Payer: Self-pay | Admitting: Obstetrics

## 2014-11-22 VITALS — BP 120/68 | HR 92 | Wt 265.0 lb

## 2014-11-22 DIAGNOSIS — O3432 Maternal care for cervical incompetence, second trimester: Secondary | ICD-10-CM | POA: Diagnosis not present

## 2014-11-22 DIAGNOSIS — O0992 Supervision of high risk pregnancy, unspecified, second trimester: Secondary | ICD-10-CM

## 2014-11-22 LAB — POCT URINALYSIS DIPSTICK
BILIRUBIN UA: NEGATIVE
Blood, UA: NEGATIVE
KETONES UA: NEGATIVE
Nitrite, UA: NEGATIVE
Spec Grav, UA: 1.015
Urobilinogen, UA: NEGATIVE
pH, UA: 5

## 2014-11-22 NOTE — Progress Notes (Signed)
Subjective:    Tamara Green is a 34 y.o. female being seen today for her obstetrical visit. She is at [redacted]w[redacted]d gestation. Patient reports: no complaints . Fetal movement: normal.  Problem List Items Addressed This Visit    None    Visit Diagnoses    High-risk pregnancy supervision, second trimester    -  Primary    Relevant Orders    POCT urinalysis dipstick (Completed)      Patient Active Problem List   Diagnosis Date Noted  . Advanced maternal age in multigravida 09/14/2014  . Symptomatic anemia 02/03/2014  . Rheumatoid arthritis ([place]) 02/03/2014  . Anemia 02/03/2014   Objective:    BP 120/68 mmHg  Pulse 92  Wt 265 lb (120.203 kg)  LMP 06/15/2014 FHT: 150 BPM  Uterine Size: size equals dates     Assessment:    Pregnancy @ [redacted]w[redacted]d    Plan:      Labs, problem list reviewed and updated 2 hr GTT planned Follow up in 1 weeks.

## 2014-11-22 NOTE — Progress Notes (Signed)
Pt states that some itching at injection site of 17P.

## 2014-11-29 ENCOUNTER — Ambulatory Visit (INDEPENDENT_AMBULATORY_CARE_PROVIDER_SITE_OTHER): Payer: Medicaid Other | Admitting: Obstetrics

## 2014-11-29 ENCOUNTER — Encounter: Payer: Self-pay | Admitting: Obstetrics

## 2014-11-29 VITALS — BP 115/70 | HR 99 | Wt 267.0 lb

## 2014-11-29 DIAGNOSIS — Z3482 Encounter for supervision of other normal pregnancy, second trimester: Secondary | ICD-10-CM

## 2014-11-29 DIAGNOSIS — O3432 Maternal care for cervical incompetence, second trimester: Secondary | ICD-10-CM | POA: Diagnosis not present

## 2014-11-29 LAB — POCT URINALYSIS DIPSTICK
BILIRUBIN UA: NEGATIVE
Glucose, UA: NEGATIVE
Ketones, UA: NEGATIVE
NITRITE UA: NEGATIVE
PH UA: 6
RBC UA: NEGATIVE
Spec Grav, UA: 1.01
Urobilinogen, UA: NEGATIVE

## 2014-11-29 NOTE — Progress Notes (Signed)
Subjective:    Tamara Green is a 34 y.o. female being seen today for her obstetrical visit. She is at [redacted]w[redacted]d gestation. Patient reports: no complaints . Fetal movement: normal.  Problem List Items Addressed This Visit    None    Visit Diagnoses    Encounter for supervision of other normal pregnancy in second trimester    -  Primary    Relevant Orders    POCT urinalysis dipstick (Completed)      Patient Active Problem List   Diagnosis Date Noted  . Advanced maternal age in multigravida 09/14/2014  . Symptomatic anemia 02/03/2014  . Rheumatoid arthritis ([place]) 02/03/2014  . Anemia 02/03/2014   Objective:    BP 115/70 mmHg  Pulse 99  Wt 267 lb (121.11 kg)  LMP 06/15/2014 FHT: 150 BPM  Uterine Size: size equals dates     Assessment:    Pregnancy @ [redacted]w[redacted]d    Plan:    OBGCT: ordered for next visit. Signs and symptoms of preterm labor: discussed.  Labs, problem list reviewed and updated 2 hr GTT planned Follow up in 3 weeks.

## 2014-11-30 ENCOUNTER — Ambulatory Visit (HOSPITAL_COMMUNITY)
Admission: RE | Admit: 2014-11-30 | Discharge: 2014-11-30 | Disposition: A | Discharge status: Home / Self Care | Payer: Medicaid Other | Source: Ambulatory Visit | Attending: Obstetrics | Admitting: Obstetrics

## 2014-11-30 ENCOUNTER — Other Ambulatory Visit (HOSPITAL_COMMUNITY): Payer: Self-pay | Admitting: Maternal and Fetal Medicine

## 2014-11-30 ENCOUNTER — Encounter (HOSPITAL_COMMUNITY): Payer: Self-pay

## 2014-11-30 DIAGNOSIS — O09292 Supervision of pregnancy with other poor reproductive or obstetric history, second trimester: Secondary | ICD-10-CM | POA: Diagnosis not present

## 2014-11-30 DIAGNOSIS — O09892 Supervision of other high risk pregnancies, second trimester: Secondary | ICD-10-CM

## 2014-11-30 DIAGNOSIS — O09522 Supervision of elderly multigravida, second trimester: Secondary | ICD-10-CM | POA: Diagnosis not present

## 2014-11-30 DIAGNOSIS — O09212 Supervision of pregnancy with history of pre-term labor, second trimester: Secondary | ICD-10-CM | POA: Diagnosis not present

## 2014-11-30 DIAGNOSIS — Z3A24 24 weeks gestation of pregnancy: Secondary | ICD-10-CM | POA: Diagnosis not present

## 2014-11-30 DIAGNOSIS — E669 Obesity, unspecified: Secondary | ICD-10-CM | POA: Diagnosis not present

## 2014-11-30 DIAGNOSIS — O99210 Obesity complicating pregnancy, unspecified trimester: Secondary | ICD-10-CM | POA: Diagnosis not present

## 2014-11-30 DIAGNOSIS — O3432 Maternal care for cervical incompetence, second trimester: Secondary | ICD-10-CM | POA: Diagnosis not present

## 2014-11-30 MED ORDER — BETAMETHASONE SOD PHOS & ACET 6 (3-3) MG/ML IJ SUSP
12.0000 mg | Freq: Once | INTRAMUSCULAR | Status: AC
  Administered 2014-11-30: 12 mg via INTRAMUSCULAR
  Filled 2014-11-30: qty 2

## 2014-12-01 ENCOUNTER — Telehealth: Payer: Self-pay

## 2014-12-01 ENCOUNTER — Ambulatory Visit (HOSPITAL_COMMUNITY)
Admission: RE | Admit: 2014-12-01 | Discharge: 2014-12-01 | Disposition: A | Discharge status: Home / Self Care | Payer: Medicaid Other | Source: Ambulatory Visit | Attending: Obstetrics | Admitting: Obstetrics

## 2014-12-01 DIAGNOSIS — O3432 Maternal care for cervical incompetence, second trimester: Secondary | ICD-10-CM | POA: Diagnosis not present

## 2014-12-01 DIAGNOSIS — O09212 Supervision of pregnancy with history of pre-term labor, second trimester: Secondary | ICD-10-CM | POA: Insufficient documentation

## 2014-12-01 DIAGNOSIS — Z3A24 24 weeks gestation of pregnancy: Secondary | ICD-10-CM | POA: Diagnosis not present

## 2014-12-01 DIAGNOSIS — O09522 Supervision of elderly multigravida, second trimester: Secondary | ICD-10-CM | POA: Insufficient documentation

## 2014-12-01 MED ORDER — BETAMETHASONE SOD PHOS & ACET 6 (3-3) MG/ML IJ SUSP
12.0000 mg | Freq: Once | INTRAMUSCULAR | Status: AC
  Administered 2014-12-01: 12 mg via INTRAMUSCULAR
  Filled 2014-12-01: qty 2

## 2014-12-01 NOTE — Telephone Encounter (Signed)
PATIENT CALLED SEVERAL TIMES, DEMANDING TO SPEAK TO DR HARPER, HE WAS IN ROOM WITH PATIENT - VERY BUSY DAY - TOLD HER SEVERAL TIMES TO LEAVE MESSAGE WITH  NURSE - SAID SHE DID, BUT, SHE CONTINUED TO CALL OVER AND OVER - STATED I WAS RUDE TO HER FOR JUST TRANSFERRING HER BACK AND SHE WOULD TELL DR HARPER - WANTED TO "TAKE MY NAME", ETC.  DR HARPER USUALLY TELLS [place] IF HE NEEDS TO SPEAK WITH PATIENT.

## 2014-12-06 ENCOUNTER — Encounter: Payer: Self-pay | Admitting: *Deleted

## 2014-12-06 ENCOUNTER — Ambulatory Visit (INDEPENDENT_AMBULATORY_CARE_PROVIDER_SITE_OTHER): Payer: Medicaid Other | Admitting: *Deleted

## 2014-12-06 VITALS — BP 123/73 | HR 98 | Wt 263.0 lb

## 2014-12-06 DIAGNOSIS — O3432 Maternal care for cervical incompetence, second trimester: Secondary | ICD-10-CM

## 2014-12-06 DIAGNOSIS — O0992 Supervision of high risk pregnancy, unspecified, second trimester: Secondary | ICD-10-CM

## 2014-12-06 NOTE — Progress Notes (Signed)
Pt is in office today for 17p injection. Pt tolerated injection well. Pt was inquiring about 2 hour glucose testing, pt thought her testing was at today's appt. Pt was made aware of date and time of glucose test appt.   Pt states that she is needing a letter for work due to going out of work, pt was seen by Opal Sidles to obtain letter.  Pt also asked to see office manager and was seen by Anderson Malta today.  BP 123/73 mmHg  Pulse 98  Wt 263 lb (119.296 kg)  LMP 06/15/2014  Administrations This Visit    hydroxyprogesterone caproate (DELALUTIN) 250 mg/mL injection 250 mg    Admin Date Action Dose Route Administered By         12/06/2014 Given 250 mg Intramuscular Valene Bors, CMA

## 2014-12-08 ENCOUNTER — Encounter (HOSPITAL_COMMUNITY): Payer: Self-pay | Admitting: *Deleted

## 2014-12-08 ENCOUNTER — Other Ambulatory Visit: Payer: Self-pay | Admitting: Certified Nurse Midwife

## 2014-12-08 ENCOUNTER — Inpatient Hospital Stay (HOSPITAL_COMMUNITY)
Admission: AD | Admit: 2014-12-08 | Discharge: 2014-12-18 | DRG: 765 | Disposition: A | Discharge status: Home / Self Care | Payer: Medicaid Other | Source: Ambulatory Visit | Attending: Obstetrics | Admitting: Obstetrics

## 2014-12-08 DIAGNOSIS — O3432 Maternal care for cervical incompetence, second trimester: Secondary | ICD-10-CM | POA: Diagnosis present

## 2014-12-08 DIAGNOSIS — D259 Leiomyoma of uterus, unspecified: Secondary | ICD-10-CM

## 2014-12-08 DIAGNOSIS — O322XX0 Maternal care for transverse and oblique lie, not applicable or unspecified: Secondary | ICD-10-CM | POA: Diagnosis present

## 2014-12-08 DIAGNOSIS — O09299 Supervision of pregnancy with other poor reproductive or obstetric history, unspecified trimester: Secondary | ICD-10-CM

## 2014-12-08 DIAGNOSIS — O42912 Preterm premature rupture of membranes, unspecified as to length of time between rupture and onset of labor, second trimester: Secondary | ICD-10-CM | POA: Diagnosis present

## 2014-12-08 DIAGNOSIS — Z8751 Personal history of pre-term labor: Secondary | ICD-10-CM

## 2014-12-08 DIAGNOSIS — Z23 Encounter for immunization: Secondary | ICD-10-CM

## 2014-12-08 DIAGNOSIS — Z6841 Body Mass Index (BMI) 40.0 and over, adult: Secondary | ICD-10-CM | POA: Diagnosis not present

## 2014-12-08 DIAGNOSIS — O8612 Endometritis following delivery: Secondary | ICD-10-CM | POA: Diagnosis not present

## 2014-12-08 DIAGNOSIS — Z3A25 25 weeks gestation of pregnancy: Secondary | ICD-10-CM | POA: Diagnosis not present

## 2014-12-08 DIAGNOSIS — O42919 Preterm premature rupture of membranes, unspecified as to length of time between rupture and onset of labor, unspecified trimester: Secondary | ICD-10-CM

## 2014-12-08 DIAGNOSIS — O9902 Anemia complicating childbirth: Secondary | ICD-10-CM | POA: Diagnosis present

## 2014-12-08 DIAGNOSIS — O3412 Maternal care for benign tumor of corpus uteri, second trimester: Secondary | ICD-10-CM

## 2014-12-08 DIAGNOSIS — O99214 Obesity complicating childbirth: Secondary | ICD-10-CM | POA: Diagnosis present

## 2014-12-08 DIAGNOSIS — Z98891 History of uterine scar from previous surgery: Secondary | ICD-10-CM

## 2014-12-08 DIAGNOSIS — O09522 Supervision of elderly multigravida, second trimester: Secondary | ICD-10-CM

## 2014-12-08 DIAGNOSIS — N883 Incompetence of cervix uteri: Secondary | ICD-10-CM | POA: Diagnosis not present

## 2014-12-08 DIAGNOSIS — O42012 Preterm premature rupture of membranes, onset of labor within 24 hours of rupture, second trimester: Secondary | ICD-10-CM | POA: Diagnosis not present

## 2014-12-08 DIAGNOSIS — O4290 Premature rupture of membranes, unspecified as to length of time between rupture and onset of labor, unspecified weeks of gestation: Secondary | ICD-10-CM | POA: Diagnosis present

## 2014-12-08 DIAGNOSIS — D649 Anemia, unspecified: Secondary | ICD-10-CM | POA: Diagnosis present

## 2014-12-08 DIAGNOSIS — O3433 Maternal care for cervical incompetence, third trimester: Secondary | ICD-10-CM

## 2014-12-08 DIAGNOSIS — O99210 Obesity complicating pregnancy, unspecified trimester: Secondary | ICD-10-CM

## 2014-12-08 LAB — POCT FERN TEST: POCT FERN TEST: POSITIVE

## 2014-12-08 MED ORDER — AZITHROMYCIN 500 MG PO TABS
500.0000 mg | ORAL_TABLET | Freq: Every day | ORAL | Status: DC
  Administered 2014-12-08 – 2014-12-10 (×3): 500 mg via ORAL
  Filled 2014-12-08 (×3): qty 2
  Filled 2014-12-08: qty 1

## 2014-12-08 MED ORDER — MAGNESIUM SULFATE 50 % IJ SOLN
2.0000 g/h | INTRAVENOUS | Status: DC
  Administered 2014-12-09: 2 g/h via INTRAVENOUS
  Filled 2014-12-08 (×2): qty 80

## 2014-12-08 MED ORDER — ACETAMINOPHEN 325 MG PO TABS
650.0000 mg | ORAL_TABLET | ORAL | Status: DC | PRN
  Administered 2014-12-09 (×2): 650 mg via ORAL
  Filled 2014-12-08 (×2): qty 2

## 2014-12-08 MED ORDER — CALCIUM CARBONATE ANTACID 500 MG PO CHEW
2.0000 | CHEWABLE_TABLET | ORAL | Status: DC | PRN
  Administered 2014-12-10: 400 mg via ORAL
  Filled 2014-12-08: qty 2

## 2014-12-08 MED ORDER — DOCUSATE SODIUM 100 MG PO CAPS
100.0000 mg | ORAL_CAPSULE | Freq: Every day | ORAL | Status: DC
  Administered 2014-12-08 – 2014-12-17 (×8): 100 mg via ORAL
  Filled 2014-12-08 (×9): qty 1

## 2014-12-08 MED ORDER — SODIUM CHLORIDE 0.9 % IV SOLN
2.0000 g | Freq: Four times a day (QID) | INTRAVENOUS | Status: DC
  Administered 2014-12-08 – 2014-12-11 (×9): 2 g via INTRAVENOUS
  Filled 2014-12-08 (×11): qty 2000

## 2014-12-08 MED ORDER — PRENATAL MULTIVITAMIN CH
1.0000 | ORAL_TABLET | Freq: Every day | ORAL | Status: DC
  Administered 2014-12-09 – 2014-12-10 (×2): 1 via ORAL
  Filled 2014-12-08 (×2): qty 1

## 2014-12-08 MED ORDER — ZOLPIDEM TARTRATE 5 MG PO TABS
5.0000 mg | ORAL_TABLET | Freq: Every evening | ORAL | Status: DC | PRN
  Administered 2014-12-09: 5 mg via ORAL
  Filled 2014-12-08: qty 1

## 2014-12-08 MED ORDER — LACTATED RINGERS IV SOLN
INTRAVENOUS | Status: DC
  Administered 2014-12-08 – 2014-12-11 (×7): via INTRAVENOUS

## 2014-12-08 MED ORDER — MAGNESIUM SULFATE BOLUS VIA INFUSION
4.0000 g | Freq: Once | INTRAVENOUS | Status: AC
  Administered 2014-12-08: 4 g via INTRAVENOUS
  Filled 2014-12-08: qty 500

## 2014-12-08 NOTE — MAU Note (Signed)
Pt states water broke around 2030.  Pt arrived to MAU via wheelchair.  Pants and shoes were dripping wet.  Pt states she is having no bleeding and contractions.  Pt states she is feeling the baby move.

## 2014-12-08 NOTE — MAU Provider Note (Signed)
Chief Complaint:  Rupture of Membranes   First Provider Initiated Contact with Patient 12/08/14 2050     HPI Tamara Green is a 34 y.o. [id number] at 18w1dwho presents to maternity admissions reporting Leaking water about 15 minutes ago.  No contractions  Has had a cerclage and cervix was closed but short this week. She reports good fetal movement, denies vaginal bleeding, vaginal itching/burning, urinary symptoms, h/a, dizziness, n/v, or fever/chills.  She denies headache, visual changes or abdominal pain.  Past Medical History: Past Medical History  Diagnosis Date  . Anemia   . Incompetent cervix   . SVD (spontaneous vaginal delivery)     x 3  . Heartburn in pregnancy   . Rheumatoid arthritis(714.0)     knees  . History of blood transfusion 01/2014    [facility] - units 2 transfused    Past obstetric history: OB History  Gravida Para Term Preterm AB SAB TAB Ectopic Multiple Living  6 3 2 1 2 2    3     # Outcome Date GA Lbr Len/2nd Weight Sex Delivery Anes PTL Lv  6 Current           5 SAB           4 SAB           3 Preterm           2 Term           1 Term               Past Surgical History: Past Surgical History  Procedure Laterality Date  . Cervical cerclage      x 3  . Breast biopsy      right  . Cervical cerclage N/A 09/17/2014    Procedure: CERCLAGE CERVICAL;  Surgeon: Shelly Bombard, MD;  Location: [facility];  Service: Gynecology;  Laterality: N/A;    Family History: Family History  Problem Relation Age of Onset  . Diabetes Mother     Social History: Social History  Substance Use Topics  . Smoking status: Former Smoker -- 0.50 packs/day for 6 years    Types: Cigarettes    Quit date: 01/09/2012  . Smokeless tobacco: Never Used  . Alcohol Use: No    Allergies:  Allergies  Allergen Reactions  . Percocet [Oxycodone-Acetaminophen] Hives    Pt has tolerated vicodin in the past.    Meds:  Facility-administered medications prior to admission   Medication Dose Route Frequency Provider Last Rate Last Dose  . hydroxyprogesterone caproate (DELALUTIN) 250 mg/mL injection 250 mg  250 mg Intramuscular Weekly Shelly Bombard, MD   250 mg at 12/06/14 1154   Prescriptions prior to admission  Medication Sig Dispense Refill Last Dose  . Doxylamine-Pyridoxine (DICLEGIS) 10-10 MG TBEC 1 tab in AM, 1 tab mid afternoon 2 tabs at bedtime. Max dose 4 tabs daily. (Patient taking differently: Take 2 tablets by mouth at bedtime as needed (for nausea). ) 100 tablet 5 Taking  . ferrous sulfate 325 (65 FE) MG tablet Take 325 mg by mouth daily with breakfast.   Taking  . hydrocortisone cream 1 % Apply topically 3 (three) times daily. (Patient not taking: Reported on 11/08/2014) 30 g 0 Not Taking  . metroNIDAZOLE (FLAGYL) 500 MG tablet Take 1 tablet (500 mg total) by mouth 2 (two) times daily. (Patient not taking: Reported on 11/16/2014) 14 tablet 2 Not Taking  . Prenat-FeAsp-Meth-FA-DHA w/o A (PRENATE PIXIE) 10-0.6-0.4-200 MG CAPS Take  1 capsule by mouth daily. 30 capsule 11 Taking   ROS:  Review of Systems  Constitutional: Negative for fever and chills.  Gastrointestinal: Negative for nausea, vomiting, abdominal pain, diarrhea and constipation.  Genitourinary: Positive for vaginal discharge (watery). Negative for dysuria, flank pain, vaginal bleeding and pelvic pain.  Musculoskeletal: Negative for back pain.  Psychiatric/Behavioral: The patient is nervous/anxious.    I have reviewed patient's Past Medical Hx, Surgical Hx, Family Hx, Social Hx, medications and allergies.   Physical Exam  Patient Vitals for the past 24 hrs:  BP Temp Temp src Pulse Resp  12/08/14 2043 144/76 mmHg 99.4 F (37.4 C) Oral 98 18   Constitutional: Well-developed, well-nourished female in no acute distress.  Cardiovascular: normal rate Respiratory: normal effort GI: Abd soft, non-tender, gravid appropriate for gestational age.  MS: Extremities nontender, no edema, normal  ROM Neurologic: Alert and oriented x 4.  GU: Neg CVAT.  PELVIC EXAM: Cervix pink, visually closed with stitch intact, short, copious watery clear discharge, vaginal walls and external genitalia normal Cervix closed/80%/high  Dilation: Closed Exam by:: Carmelia Roller CNM  FHT:  Baseline 145 , moderate variability, accelerations present, no decelerations Contractions:  Rare   Labs: Results for orders placed or performed during the hospital encounter of 12/08/14 (from the past 24 hour(s))  Fern Test     Status: Abnormal   Collection Time: 12/08/14  8:46 PM  Result Value Ref Range   POCT Fern Test Positive = ruptured amniotic membanes    --/--/O NEG (09/01 0845)  Imaging:   MAU Course/MDM:  Consult Dr Jodi Mourning with presentation, exam findings and test results.  Treatments in MAU included prep for admission    Assessment: SIUP at [redacted]w[redacted]d  PPROM clear fluid Not in labor  Plan: Admit to Antenatal Routine orders Had Betamethasone last week Prophylactic MgSO4 Other orders per Dr Jodi Mourning    Medication List    ASK your doctor about these medications        Doxylamine-Pyridoxine 10-10 MG Tbec  Commonly known as:  DICLEGIS  1 tab in AM, 1 tab mid afternoon 2 tabs at bedtime. Max dose 4 tabs daily.     ferrous sulfate 325 (65 FE) MG tablet  Take 325 mg by mouth daily with breakfast.     hydrocortisone cream 1 %  Apply topically 3 (three) times daily.     metroNIDAZOLE 500 MG tablet  Commonly known as:  FLAGYL  Take 1 tablet (500 mg total) by mouth 2 (two) times daily.     PRENATE PIXIE 10-0.6-0.4-200 MG Caps  Take 1 capsule by mouth daily.        Hansel Feinstein CNM, MSN Certified Nurse-Midwife 12/08/2014 8:50 PM

## 2014-12-08 NOTE — Progress Notes (Signed)
Called to notify of pt arrival in MAU and SROM with positive Fern. Received orders to admit to antenatal

## 2014-12-09 ENCOUNTER — Inpatient Hospital Stay (HOSPITAL_COMMUNITY): Payer: Medicaid Other

## 2014-12-09 ENCOUNTER — Encounter (HOSPITAL_COMMUNITY): Payer: Medicaid Other

## 2014-12-09 ENCOUNTER — Encounter (HOSPITAL_COMMUNITY): Payer: Self-pay | Admitting: *Deleted

## 2014-12-09 LAB — TYPE AND SCREEN
ABO/RH(D): O NEG
ANTIBODY SCREEN: NEGATIVE

## 2014-12-09 MED ORDER — MEPERIDINE HCL 25 MG/ML IJ SOLN
25.0000 mg | Freq: Once | INTRAMUSCULAR | Status: AC
  Administered 2014-12-09: 25 mg via INTRAVENOUS
  Filled 2014-12-09: qty 1

## 2014-12-09 MED ORDER — DOXYLAMINE SUCCINATE (SLEEP) 25 MG PO TABS
25.0000 mg | ORAL_TABLET | Freq: Every evening | ORAL | Status: DC | PRN
  Filled 2014-12-09: qty 1

## 2014-12-09 MED ORDER — PROCHLORPERAZINE EDISYLATE 5 MG/ML IJ SOLN
10.0000 mg | Freq: Four times a day (QID) | INTRAMUSCULAR | Status: DC | PRN
  Administered 2014-12-09: 10 mg via INTRAVENOUS
  Filled 2014-12-09 (×2): qty 2

## 2014-12-09 MED ORDER — BUTALBITAL-APAP-CAFFEINE 50-325-40 MG PO TABS
2.0000 | ORAL_TABLET | Freq: Four times a day (QID) | ORAL | Status: DC | PRN
  Administered 2014-12-09 – 2014-12-11 (×6): 2 via ORAL
  Filled 2014-12-09 (×6): qty 2

## 2014-12-09 MED ORDER — DOXYLAMINE-PYRIDOXINE 10-10 MG PO TBEC
2.0000 | DELAYED_RELEASE_TABLET | Freq: Every evening | ORAL | Status: DC | PRN
  Filled 2014-12-09: qty 1

## 2014-12-09 MED ORDER — PYRIDOXINE HCL 25 MG PO TABS
25.0000 mg | ORAL_TABLET | Freq: Every evening | ORAL | Status: DC | PRN
  Filled 2014-12-09: qty 1

## 2014-12-09 MED ORDER — DIPHENHYDRAMINE HCL 50 MG/ML IJ SOLN
25.0000 mg | Freq: Three times a day (TID) | INTRAMUSCULAR | Status: DC | PRN
Start: 2014-12-09 — End: 2014-12-11
  Administered 2014-12-09: 25 mg via INTRAVENOUS
  Filled 2014-12-09: qty 1

## 2014-12-09 NOTE — Progress Notes (Signed)
Dr. Jodi Mourning notified regarding change in [place] baseline.  MD also informed of non-recurrent decels noted during the day.

## 2014-12-09 NOTE — H&P (Signed)
Tamara Green is a 34 y.o. female presenting for PPROM. Maternal Medical History:  Reason for admission: Rupture of membranes.   Fetal activity: Perceived fetal activity is normal.   Last perceived fetal movement was within the past hour.    Prenatal complications: no prenatal complications Prenatal Complications - Diabetes: none.    OB History    Gravida Para Term Preterm AB TAB SAB Ectopic Multiple Living   6 3 2 1 2  2   3      Past Medical History  Diagnosis Date  . Anemia   . Incompetent cervix   . SVD (spontaneous vaginal delivery)     x 3  . Heartburn in pregnancy   . Rheumatoid arthritis(714.0)     knees  . History of blood transfusion 01/2014    [facility] - units 2 transfused   Past Surgical History  Procedure Laterality Date  . Cervical cerclage      x 3  . Breast biopsy      right  . Cervical cerclage N/A 09/17/2014    Procedure: CERCLAGE CERVICAL;  Surgeon: Tamara Bombard, MD;  Location: [facility];  Service: Gynecology;  Laterality: N/A;   Family History: family history includes Diabetes in her mother. Social History:  reports that she quit smoking about 2 years ago. Her smoking use included Cigarettes. She has a 3 pack-year smoking history. She has never used smokeless tobacco. She reports that she does not drink alcohol or use illicit drugs.   Prenatal Transfer Tool  Maternal Diabetes: No Genetic Screening: Normal Maternal Ultrasounds/Referrals: Normal Fetal Ultrasounds or other Referrals:  Referred to Materal Fetal Medicine  Maternal Substance Abuse:  No Significant Maternal Medications:  Meds include: Progesterone Significant Maternal Lab Results:  None Other Comments:  None  Review of Systems  All other systems reviewed and are negative.   Dilation: Closed Exam by:: Tamara Green CNM Blood pressure 117/66, pulse 83, temperature 98.1 F (36.7 C), temperature source Oral, resp. rate 16, last menstrual period 06/15/2014. Maternal Exam:   Abdomen: Patient reports no abdominal tenderness. Introitus: Normal vulva. Normal vagina.  Ferning test: positive.      Physical Exam  Nursing note and vitals reviewed. Constitutional: She is oriented to person, place, and time. She appears well-developed and well-nourished.  HENT:  Head: Normocephalic and atraumatic.  Eyes: Conjunctivae are normal. Pupils are equal, round, and reactive to light.  Neck: Normal range of motion. Neck supple.  Cardiovascular: Normal rate and regular rhythm.   Respiratory: Effort normal and breath sounds normal.  GI: Soft. Bowel sounds are normal.  Genitourinary: Vagina normal and uterus normal.  Musculoskeletal: Normal range of motion.  Neurological: She is alert and oriented to person, place, and time.  Skin: Skin is warm and dry.  Psychiatric: She has a normal mood and affect. Her behavior is normal. Judgment and thought content normal.    Prenatal labs: ABO, Rh: --/--/O NEG (09/01 0845) Antibody: NEG (09/01 0845) Rubella: 0.44 (08/30 1725) RPR: NON REAC (08/30 1725)  HBsAg: NEGATIVE (08/30 1725)  HIV: Non Reactive (08/15 1830)  GBS:     Assessment/Plan: 25 weeks.  PPROM.  Admit.   Tamara Green A 12/09/2014, 12:10 AM

## 2014-12-10 ENCOUNTER — Inpatient Hospital Stay (HOSPITAL_COMMUNITY): Payer: Medicaid Other

## 2014-12-10 LAB — URINE MICROSCOPIC-ADD ON: Bacteria, UA: NONE SEEN

## 2014-12-10 LAB — URINALYSIS, ROUTINE W REFLEX MICROSCOPIC
BILIRUBIN URINE: NEGATIVE
HGB URINE DIPSTICK: NEGATIVE
Ketones, ur: NEGATIVE mg/dL
Leukocytes, UA: NEGATIVE
Nitrite: NEGATIVE
PH: 7 (ref 5.0–8.0)
Protein, ur: NEGATIVE mg/dL
SPECIFIC GRAVITY, URINE: 1.01 (ref 1.005–1.030)

## 2014-12-10 MED ORDER — MAGNESIUM SULFATE BOLUS VIA INFUSION
4.0000 g | Freq: Once | INTRAVENOUS | Status: AC
  Administered 2014-12-10: 4 g via INTRAVENOUS
  Filled 2014-12-10: qty 500

## 2014-12-10 MED ORDER — MAGNESIUM SULFATE 50 % IJ SOLN
2.0000 g/h | INTRAVENOUS | Status: DC
  Filled 2014-12-10: qty 80

## 2014-12-10 NOTE — Progress Notes (Signed)
Dr. Jodi Mourning notified regarding pt's c/o frequent urination, urgency, and voiding small amt.  Pt denies dysuria or burning with urination.  Order obtained to urine specimen for culture.

## 2014-12-10 NOTE — Progress Notes (Signed)
Urine specimen obtained via straight cath secondary pt grossly ruptured

## 2014-12-10 NOTE — Progress Notes (Signed)
MFM Consult, Staff Note  1. Cerclage in-situ in setting of pPROM, no contractions, no pain, and no active bleeding from cerclage site: This is a commonly encountered situation and an intact cerclage is beneficial for this patient at this very preterm gestational age.  She understands that the cerclage is not under any tension, she is not in active labor currently, and she is not actively abrupting or bleeding at all from the cerclage site. She understands that the cerclage is theoretically providing support and aid of her cervix in it's ability to continue to maintain the pregnancy in utero in absence of infection/labor/evolving abruption.   She also understands risk that she could rapidly progress in labor or have an acute evolution of a placental abruption. There is no sonographic evidence of abruption. If she rapidly progresses into labor or begins to definitively labor with evidence of tension on the cerclage, bleeding from the cervix/cerclage site or painfully contract, I recommend the cerclage be removed immediately.   She desires to keep the cerclage in situ until labor/infection/active vaginal bleeding and understands the risk including hemorrhage, or injury to cervix. She understands that cerclage removal will be more difficult if under tension. She also understands that removal now may lead to bleeding, infection, injury of cervix and potentially expedite very preterm delivery. I spent considerable time at bedside weighing the options. That being said, she does not desire cerclage removal at this time.  2.  Antenatal corticosteroid course (BMZ 12mg  IM q24 h x 2 doses) and MgSO4 x 12 hours for CP prophylaxis were both appropriate as ordered.    3. Preterm premature rupture of membranes: First, I agree with latency antibiotic course for 7 days as already initiated. Second, I explained to her that at this point our primary concern was for ascending intrauterine/intraamniotic infection, which  poses risk to her as well as her fetus. She understood that inpatient management was essential with serial examinations and fetal heart rate tracings to screen for onset of infection and that evidence of such would prompt delivery.  Rates of spontaneous delivery within 7 days of rupture is 50% and 75% within 28 days of rupture. I explained to her timing of delivery in absence of infection and in presence of reassuring fetal status has been debated historically and recently by many experts. Regardless, I cited to her that the best available and most well-accepted timing of delivery in context of otherwise reassuring maternal-fetus status affected by pPROM is at [redacted] weeks gestational age. This most fully balances the risk of prematurity against that of an occult intrauterine infection. Occult intrauterine infection is the second leading cause of cerebral palsy as opposed to the leading cause which is, of course, prematurity. She seemed to grasp the concept as well as our specialty's rationale.  4. Tocolysis in context of pPROM, no clear evidence of abruption:  Should she begin to labor, I would repeat MgSO4 for CP prophylaxis. I would continue this for 12-24 hours and add Procardia for tocolysis x 48 hours. The patient understands risks/benefits. If the patient has onset of bright red vaginal bleeding/clear abruption picture or tension on the cerclage stitch then I would discontinue tocolysis attempts and remove the cerclage.  All of your patient's questions were addressed to her satisfaction today. Although this was a lot of information to take in, she and her husband demonstrated excellent comprehension of my impression, recommendations, limitations of medical care in the preterm state, and the underlying rationale.  Summary of Recommendations:  1.  Completion BMZ x 2.   2. s/p Magnesium Sulfate neuroprophylaxis 3 Latency antibiotics 7 day course: 48 hr IV and 5 days of PO. 4. SCD's or TED hose for DVT  prophylaxis; 5. NICU consultation  6. Would perform daily EFM  7. weekly ultrasound for position/AFI and translabial cervical imaging to ensure cerclage is intact 8. Removal of cerclage if infection/abruption/labor/tension on stitch become apparent clinically 9. Timing of delivery should be anticipated by 34 weeks provided that testing remains reassuring and there is no evidence of chorioamnionitis; 10. I would deliver prior to 34 weeks for nonreassuring fetal testing, evidence of chorioamnionitis, or other evidence of maternal or fetal deterioration; 11. Interval growth should be continually reassessed every 2-3 weeks to provide ongoing dialogue with NICU for survival rates/prognosis. 12. The patient should be monitored by serial clinical exams, serial vital signs, daily EFM's beginning at 22 weeks/NST's (more often if clinically warranted), CBC only as clinically indicated (noting that WBC at or above 20,000 constitutes leukocytosis for pregnancy).  Please do not hesitate to contact me for further questions/input.  Time Spent: I spent in excess of 40 minutes in consultation with this patient to review records, evaluate her case, and provide her with an adequate discussion and education. More than 50% of this time was spent in direct face-to-face counseling.  Thank you,  Delman Cheadle Harl Favor, Delman Cheadle, MD, MS, FACOG Assistant Professor Section of [place]

## 2014-12-10 NOTE — Progress Notes (Signed)
Patient ID: Einar Gip, female   DOB: 1980-06-08, 34 y.o.   MRN: [id number] Hospital Day: 3  S: Preterm labor symptoms: fluid leakage and pelvic pressure.  States that she continues to leak small amounts of fluid vaginally.  Denies any contractions.  Baby is moving well.   O: Blood pressure 119/63, pulse 91, temperature 97.7 F (36.5 C), temperature source Oral, resp. rate 18, height 5\' 8"  (1.727 m), weight 266 lb (120.657 kg), last menstrual period 06/15/2014.   [id number]: 130 bpm, Variability: Good {> 6 bpm), Accelerations: Reactive and Decelerations: Variable: mild Toco: None [id number]: Closed Exam by:: Carmelia Roller CNM  A/P- 34 y.o. admitted with:  Present on Admission:  . Premature rupture of membranes  Pregnancy Complications: PPROM, Cerclage  Preterm labor management: pelvic rest advised, modified bedrest advised, preterm labor education provided and BMZ 11/22 &11/23, Mag X 24 hours. Dating:  [redacted]w[redacted]d PNL Needed:  none FWB:  guarded PTL:  Stable

## 2014-12-11 ENCOUNTER — Encounter (HOSPITAL_COMMUNITY): Payer: Self-pay | Admitting: Anesthesiology

## 2014-12-11 ENCOUNTER — Encounter (HOSPITAL_COMMUNITY)
Admission: AD | Disposition: A | Discharge status: Home / Self Care | Payer: Self-pay | Source: Ambulatory Visit | Attending: Obstetrics

## 2014-12-11 ENCOUNTER — Inpatient Hospital Stay (HOSPITAL_COMMUNITY): Payer: Medicaid Other | Admitting: Anesthesiology

## 2014-12-11 DIAGNOSIS — Z98891 History of uterine scar from previous surgery: Secondary | ICD-10-CM

## 2014-12-11 LAB — CBC
HEMATOCRIT: 27.9 % — AB (ref 36.0–46.0)
Hemoglobin: 9.3 g/dL — ABNORMAL LOW (ref 12.0–15.0)
MCH: 27.4 pg (ref 26.0–34.0)
MCHC: 33.3 g/dL (ref 30.0–36.0)
MCV: 82.3 fL (ref 78.0–100.0)
Platelets: 172 10*3/uL (ref 150–400)
RBC: 3.39 MIL/uL — AB (ref 3.87–5.11)
RDW: 15.8 % — AB (ref 11.5–15.5)
WBC: 18.3 10*3/uL — AB (ref 4.0–10.5)

## 2014-12-11 LAB — TYPE AND SCREEN
ABO/RH(D): O NEG
ANTIBODY SCREEN: NEGATIVE

## 2014-12-11 SURGERY — Surgical Case
Anesthesia: Epidural

## 2014-12-11 MED ORDER — LACTATED RINGERS IV SOLN
INTRAVENOUS | Status: DC | PRN
Start: 2014-12-11 — End: 2014-12-11
  Administered 2014-12-11 (×2): via INTRAVENOUS

## 2014-12-11 MED ORDER — ONDANSETRON HCL 4 MG/2ML IJ SOLN
INTRAMUSCULAR | Status: DC | PRN
  Administered 2014-12-11: 4 mg via INTRAVENOUS

## 2014-12-11 MED ORDER — NALOXONE HCL 0.4 MG/ML IJ SOLN: 0.4000 mg | INTRAMUSCULAR | Status: DC | PRN

## 2014-12-11 MED ORDER — SCOPOLAMINE 1 MG/3DAYS TD PT72
MEDICATED_PATCH | TRANSDERMAL | Status: DC | PRN
  Administered 2014-12-11: 1 via TRANSDERMAL

## 2014-12-11 MED ORDER — MORPHINE SULFATE (PF) 0.5 MG/ML IJ SOLN
INTRAMUSCULAR | Status: AC
  Filled 2014-12-11: qty 10

## 2014-12-11 MED ORDER — OXYCODONE-ACETAMINOPHEN 5-325 MG PO TABS: 1.0000 | ORAL_TABLET | ORAL | Status: DC | PRN

## 2014-12-11 MED ORDER — OXYTOCIN 40 UNITS IN LACTATED RINGERS INFUSION - SIMPLE MED: 62.5000 mL/h | INTRAVENOUS | Status: DC

## 2014-12-11 MED ORDER — PHENYLEPHRINE 8 MG IN D5W 100 ML (0.08MG/ML) PREMIX OPTIME
INJECTION | INTRAVENOUS | Status: DC | PRN
  Administered 2014-12-11: 80 ug/min via INTRAVENOUS

## 2014-12-11 MED ORDER — ONDANSETRON HCL 4 MG/2ML IJ SOLN: 4.0000 mg | Freq: Three times a day (TID) | INTRAMUSCULAR | Status: DC | PRN

## 2014-12-11 MED ORDER — MORPHINE SULFATE (PF) 0.5 MG/ML IJ SOLN
INTRAMUSCULAR | Status: DC | PRN
  Administered 2014-12-11: .2 mg via INTRATHECAL

## 2014-12-11 MED ORDER — NALBUPHINE HCL 10 MG/ML IJ SOLN
5.0000 mg | INTRAMUSCULAR | Status: DC | PRN
Start: 2014-12-11 — End: 2014-12-18

## 2014-12-11 MED ORDER — MENTHOL 3 MG MT LOZG: 1.0000 | LOZENGE | OROMUCOSAL | Status: DC | PRN

## 2014-12-11 MED ORDER — DEXTROSE 5 % IV SOLN
INTRAVENOUS | Status: AC
  Filled 2014-12-11: qty 3000

## 2014-12-11 MED ORDER — MEPERIDINE HCL 25 MG/ML IJ SOLN: 6.2500 mg | INTRAMUSCULAR | Status: DC | PRN

## 2014-12-11 MED ORDER — PRENATAL MULTIVITAMIN CH
1.0000 | ORAL_TABLET | Freq: Every day | ORAL | Status: DC
  Administered 2014-12-12 – 2014-12-17 (×5): 1 via ORAL
  Filled 2014-12-11 (×5): qty 1

## 2014-12-11 MED ORDER — SIMETHICONE 80 MG PO CHEW
80.0000 mg | CHEWABLE_TABLET | ORAL | Status: DC | PRN
  Administered 2014-12-13: 80 mg via ORAL
  Filled 2014-12-11: qty 1

## 2014-12-11 MED ORDER — FENTANYL CITRATE (PF) 100 MCG/2ML IJ SOLN: 25.0000 ug | INTRAMUSCULAR | Status: DC | PRN

## 2014-12-11 MED ORDER — KETOROLAC TROMETHAMINE 30 MG/ML IJ SOLN
30.0000 mg | Freq: Four times a day (QID) | INTRAMUSCULAR | Status: AC | PRN
  Administered 2014-12-11: 30 mg via INTRAMUSCULAR

## 2014-12-11 MED ORDER — FENTANYL CITRATE (PF) 100 MCG/2ML IJ SOLN
INTRAMUSCULAR | Status: DC | PRN
  Administered 2014-12-11: 20 ug via INTRATHECAL

## 2014-12-11 MED ORDER — ZOLPIDEM TARTRATE 5 MG PO TABS
5.0000 mg | ORAL_TABLET | Freq: Every evening | ORAL | Status: DC | PRN
  Administered 2014-12-16 (×2): 5 mg via ORAL
  Filled 2014-12-11 (×2): qty 1

## 2014-12-11 MED ORDER — SCOPOLAMINE 1 MG/3DAYS TD PT72
MEDICATED_PATCH | TRANSDERMAL | Status: AC
  Filled 2014-12-11: qty 1

## 2014-12-11 MED ORDER — SENNOSIDES-DOCUSATE SODIUM 8.6-50 MG PO TABS
2.0000 | ORAL_TABLET | ORAL | Status: DC
  Administered 2014-12-11 – 2014-12-17 (×7): 2 via ORAL
  Filled 2014-12-11 (×7): qty 2

## 2014-12-11 MED ORDER — CITRIC ACID-SODIUM CITRATE 334-500 MG/5ML PO SOLN
ORAL | Status: AC
Start: 2014-12-11 — End: 2014-12-11
  Administered 2014-12-11: 30 mL
  Filled 2014-12-11: qty 15

## 2014-12-11 MED ORDER — RHO D IMMUNE GLOBULIN 1500 UNIT/2ML IJ SOSY
300.0000 ug | PREFILLED_SYRINGE | Freq: Once | INTRAMUSCULAR | Status: AC
  Administered 2014-12-12: 300 ug via INTRAMUSCULAR
  Filled 2014-12-11: qty 2

## 2014-12-11 MED ORDER — WITCH HAZEL-GLYCERIN EX PADS: 1.0000 "application " | MEDICATED_PAD | CUTANEOUS | Status: DC | PRN

## 2014-12-11 MED ORDER — DIPHENHYDRAMINE HCL 25 MG PO CAPS: 25.0000 mg | ORAL_CAPSULE | ORAL | Status: DC | PRN

## 2014-12-11 MED ORDER — MEPERIDINE HCL 25 MG/ML IJ SOLN
INTRAMUSCULAR | Status: AC
  Filled 2014-12-11: qty 1

## 2014-12-11 MED ORDER — IBUPROFEN 600 MG PO TABS
600.0000 mg | ORAL_TABLET | Freq: Four times a day (QID) | ORAL | Status: DC
  Administered 2014-12-11 – 2014-12-18 (×27): 600 mg via ORAL
  Filled 2014-12-11 (×27): qty 1

## 2014-12-11 MED ORDER — MAGNESIUM SULFATE 50 % IJ SOLN
3.0000 g/h | INTRAVENOUS | Status: DC
  Filled 2014-12-11: qty 80

## 2014-12-11 MED ORDER — OXYTOCIN 10 UNIT/ML IJ SOLN
INTRAMUSCULAR | Status: AC
  Filled 2014-12-11: qty 4

## 2014-12-11 MED ORDER — ACETAMINOPHEN 325 MG PO TABS
650.0000 mg | ORAL_TABLET | ORAL | Status: DC | PRN
  Administered 2014-12-14: 650 mg via ORAL
  Filled 2014-12-11: qty 2

## 2014-12-11 MED ORDER — ONDANSETRON HCL 4 MG/2ML IJ SOLN
INTRAMUSCULAR | Status: AC
  Filled 2014-12-11: qty 2

## 2014-12-11 MED ORDER — MEPERIDINE HCL 25 MG/ML IJ SOLN
INTRAMUSCULAR | Status: DC | PRN
  Administered 2014-12-11: 25 mg via INTRAVENOUS

## 2014-12-11 MED ORDER — SODIUM CHLORIDE 0.9 % IJ SOLN: 3.0000 mL | INTRAMUSCULAR | Status: DC | PRN

## 2014-12-11 MED ORDER — TETANUS-DIPHTH-ACELL PERTUSSIS 5-2.5-18.5 LF-MCG/0.5 IM SUSP
0.5000 mL | Freq: Once | INTRAMUSCULAR | Status: AC
  Administered 2014-12-17: 0.5 mL via INTRAMUSCULAR
  Filled 2014-12-11: qty 0.5

## 2014-12-11 MED ORDER — OXYCODONE-ACETAMINOPHEN 5-325 MG PO TABS: 2.0000 | ORAL_TABLET | ORAL | Status: DC | PRN

## 2014-12-11 MED ORDER — SIMETHICONE 80 MG PO CHEW
80.0000 mg | CHEWABLE_TABLET | Freq: Three times a day (TID) | ORAL | Status: DC
  Administered 2014-12-11 – 2014-12-17 (×16): 80 mg via ORAL
  Filled 2014-12-11 (×15): qty 1

## 2014-12-11 MED ORDER — DIPHENHYDRAMINE HCL 50 MG/ML IJ SOLN: 12.5000 mg | INTRAMUSCULAR | Status: DC | PRN

## 2014-12-11 MED ORDER — KETOROLAC TROMETHAMINE 30 MG/ML IJ SOLN
INTRAMUSCULAR | Status: AC
  Filled 2014-12-11: qty 1

## 2014-12-11 MED ORDER — NALOXONE HCL 2 MG/2ML IJ SOSY
1.0000 ug/kg/h | PREFILLED_SYRINGE | INTRAVENOUS | Status: DC | PRN
  Filled 2014-12-11: qty 2

## 2014-12-11 MED ORDER — DIBUCAINE 1 % RE OINT: 1.0000 "application " | TOPICAL_OINTMENT | RECTAL | Status: DC | PRN

## 2014-12-11 MED ORDER — HYDROCODONE-ACETAMINOPHEN 5-325 MG PO TABS
1.0000 | ORAL_TABLET | ORAL | Status: DC | PRN
  Administered 2014-12-11 – 2014-12-12 (×3): 2 via ORAL
  Filled 2014-12-11 (×4): qty 2

## 2014-12-11 MED ORDER — BUTORPHANOL TARTRATE 1 MG/ML IJ SOLN
1.0000 mg | Freq: Once | INTRAMUSCULAR | Status: AC
  Administered 2014-12-11: 1 mg via INTRAVENOUS
  Filled 2014-12-11: qty 1

## 2014-12-11 MED ORDER — LACTATED RINGERS IV SOLN: INTRAVENOUS | Status: DC

## 2014-12-11 MED ORDER — NALBUPHINE HCL 10 MG/ML IJ SOLN: 5.0000 mg | Freq: Once | INTRAMUSCULAR | Status: DC | PRN

## 2014-12-11 MED ORDER — DIPHENHYDRAMINE HCL 25 MG PO CAPS: 25.0000 mg | ORAL_CAPSULE | Freq: Four times a day (QID) | ORAL | Status: DC | PRN

## 2014-12-11 MED ORDER — BUPIVACAINE IN DEXTROSE 0.75-8.25 % IT SOLN
INTRATHECAL | Status: DC | PRN
  Administered 2014-12-11: 1.8 mL via INTRATHECAL

## 2014-12-11 MED ORDER — SIMETHICONE 80 MG PO CHEW
80.0000 mg | CHEWABLE_TABLET | ORAL | Status: DC
  Administered 2014-12-11 – 2014-12-17 (×6): 80 mg via ORAL
  Filled 2014-12-11 (×7): qty 1

## 2014-12-11 MED ORDER — LACTATED RINGERS IV SOLN
40.0000 [IU] | INTRAVENOUS | Status: DC | PRN
Start: 2014-12-11 — End: 2014-12-11
  Administered 2014-12-11: 40 [IU] via INTRAVENOUS

## 2014-12-11 MED ORDER — DEXTROSE 5 % IV SOLN
3.0000 g | INTRAVENOUS | Status: DC | PRN
  Administered 2014-12-11: 3 g via INTRAVENOUS

## 2014-12-11 MED ORDER — HYDROCODONE-ACETAMINOPHEN 5-325 MG PO TABS
1.0000 | ORAL_TABLET | ORAL | Status: DC | PRN
  Administered 2014-12-11 – 2014-12-13 (×4): 2 via ORAL
  Administered 2014-12-13: 1 via ORAL
  Administered 2014-12-13 – 2014-12-14 (×8): 2 via ORAL
  Administered 2014-12-15 (×2): 1 via ORAL
  Administered 2014-12-15 (×3): 2 via ORAL
  Administered 2014-12-16: 1 via ORAL
  Administered 2014-12-16 (×2): 2 via ORAL
  Administered 2014-12-16: 1 via ORAL
  Administered 2014-12-17: 2 via ORAL
  Administered 2014-12-18: 1 via ORAL
  Filled 2014-12-11 (×5): qty 2
  Filled 2014-12-11: qty 1
  Filled 2014-12-11 (×6): qty 2
  Filled 2014-12-11: qty 1
  Filled 2014-12-11 (×3): qty 2
  Filled 2014-12-11: qty 1
  Filled 2014-12-11 (×5): qty 2
  Filled 2014-12-11: qty 1
  Filled 2014-12-11: qty 2

## 2014-12-11 MED ORDER — KETOROLAC TROMETHAMINE 30 MG/ML IJ SOLN: 30.0000 mg | Freq: Four times a day (QID) | INTRAMUSCULAR | Status: AC | PRN

## 2014-12-11 MED ORDER — FENTANYL CITRATE (PF) 100 MCG/2ML IJ SOLN
INTRAMUSCULAR | Status: AC
  Filled 2014-12-11: qty 2

## 2014-12-11 MED ORDER — SCOPOLAMINE 1 MG/3DAYS TD PT72
1.0000 | MEDICATED_PATCH | Freq: Once | TRANSDERMAL | Status: DC
  Filled 2014-12-11: qty 1

## 2014-12-11 MED ORDER — NALBUPHINE HCL 10 MG/ML IJ SOLN: 5.0000 mg | INTRAMUSCULAR | Status: DC | PRN

## 2014-12-11 MED ORDER — LANOLIN HYDROUS EX OINT: 1.0000 "application " | TOPICAL_OINTMENT | CUTANEOUS | Status: DC | PRN

## 2014-12-11 MED ORDER — IBUPROFEN 600 MG PO TABS: 600.0000 mg | ORAL_TABLET | Freq: Four times a day (QID) | ORAL | Status: DC | PRN

## 2014-12-11 SURGICAL SUPPLY — 37 items
CLAMP CORD UMBIL (MISCELLANEOUS) IMPLANT
CLOTH BEACON ORANGE TIMEOUT ST (SAFETY) ×3 IMPLANT
DRAPE SHEET LG 3/4 BI-LAMINATE (DRAPES) IMPLANT
DRSG OPSITE POSTOP 4X10 (GAUZE/BANDAGES/DRESSINGS) ×3 IMPLANT
DURAPREP 26ML APPLICATOR (WOUND CARE) ×3 IMPLANT
ELECT REM PT RETURN 9FT ADLT (ELECTROSURGICAL) ×3
ELECTRODE REM PT RTRN 9FT ADLT (ELECTROSURGICAL) ×1 IMPLANT
EXTRACTOR VACUUM M CUP 4 TUBE (SUCTIONS) IMPLANT
EXTRACTOR VACUUM M CUP 4' TUBE (SUCTIONS)
GLOVE BIO SURGEON STRL SZ8.5 (GLOVE) ×3 IMPLANT
GLOVE BIOGEL PI IND STRL 7.0 (GLOVE) ×1 IMPLANT
GLOVE BIOGEL PI INDICATOR 7.0 (GLOVE) ×2
GOWN STRL REUS W/TWL 2XL LVL3 (GOWN DISPOSABLE) ×3 IMPLANT
GOWN STRL REUS W/TWL LRG LVL3 (GOWN DISPOSABLE) ×3 IMPLANT
KIT ABG SYR 3ML LUER SLIP (SYRINGE) IMPLANT
NEEDLE HYPO 25X5/8 SAFETYGLIDE (NEEDLE) IMPLANT
NS IRRIG 1000ML POUR BTL (IV SOLUTION) ×3 IMPLANT
PACK C SECTION WH (CUSTOM PROCEDURE TRAY) ×3 IMPLANT
PAD OB MATERNITY 4.3X12.25 (PERSONAL CARE ITEMS) ×3 IMPLANT
PENCIL SMOKE EVAC W/HOLSTER (ELECTROSURGICAL) ×3 IMPLANT
RETRACTOR TRAXI PANNICULUS (MISCELLANEOUS) ×1 IMPLANT
STAPLER VISISTAT 35W (STAPLE) ×3 IMPLANT
SUT CHROMIC 0 CT 802H (SUTURE) ×3 IMPLANT
SUT CHROMIC 0 MO4 CR (SUTURE) IMPLANT
SUT CHROMIC 1 CTX 36 (SUTURE) ×24 IMPLANT
SUT CHROMIC 1MO 4 18 CR8 (SUTURE) ×3 IMPLANT
SUT CHROMIC 2 0 SH (SUTURE) ×3 IMPLANT
SUT GUT PLAIN 0 CT-3 TAN 27 (SUTURE) IMPLANT
SUT MON AB 4-0 PS1 27 (SUTURE) ×3 IMPLANT
SUT PDS AB 0 CTX 36 PDP370T (SUTURE) IMPLANT
SUT VIC AB 0 CT1 18XCR BRD8 (SUTURE) IMPLANT
SUT VIC AB 0 CT1 8-18 (SUTURE)
SUT VIC AB 0 CTX 36 (SUTURE) ×4
SUT VIC AB 0 CTX36XBRD ANBCTRL (SUTURE) ×2 IMPLANT
TOWEL OR 17X24 6PK STRL BLUE (TOWEL DISPOSABLE) ×3 IMPLANT
TRAXI PANNICULUS RETRACTOR (MISCELLANEOUS) ×2
TRAY FOLEY CATH SILVER 14FR (SET/KITS/TRAYS/PACK) ×3 IMPLANT

## 2014-12-11 NOTE — Transfer of Care (Signed)
Immediate Anesthesia Transfer of Care Note  Patient: Tamara Green  Procedure(s) Performed: Procedure(s): CESAREAN SECTION (N/A)  Patient Location: PACU  Anesthesia Type:Spinal  Level of Consciousness: awake, alert  and oriented  Airway & Oxygen Therapy: Patient Spontanous Breathing  Post-op Assessment: Report given to RN and Post -op Vital signs reviewed and stable  Post vital signs: Reviewed and stable  Last Vitals:  Filed Vitals:   12/11/14 0302 12/11/14 0405  BP: 122/56 113/57  Pulse: 112 100  Temp:    Resp: 16 18    Complications: No apparent anesthesia complications

## 2014-12-11 NOTE — Anesthesia Preprocedure Evaluation (Signed)
Anesthesia Evaluation  Patient identified by MRN, date of birth, ID band Patient awake    Reviewed: Allergy & Precautions, NPO status , Patient's Chart, lab work & pertinent test results  Airway Mallampati: III  TM Distance: >3 FB Neck ROM: Full    Dental no notable dental hx. (+) Teeth Intact   Pulmonary former smoker,    Pulmonary exam normal breath sounds clear to auscultation       Cardiovascular negative cardio ROS Normal cardiovascular exam Rhythm:Regular Rate:Normal     Neuro/Psych negative neurological ROS  negative psych ROS   GI/Hepatic Neg liver ROS, GERD  Medicated and Controlled,  Endo/Other  Morbid obesity  Renal/GU negative Renal ROS  negative genitourinary   Musculoskeletal  (+) Arthritis , Rheumatoid disorders,    Abdominal (+) + obese,   Peds  Hematology  (+) anemia ,   Anesthesia Other Findings   Reproductive/Obstetrics (+) Pregnancy AMA 25 weeks SROM Transverse lie Incompetent cervix                             Anesthesia Physical Anesthesia Plan  ASA: III and emergent  Anesthesia Plan: Epidural   Post-op Pain Management:    Induction:   Airway Management Planned: Natural Airway  Additional Equipment:   Intra-op Plan:   Post-operative Plan:   Informed Consent: I have reviewed the patients History and Physical, chart, labs and discussed the procedure including the risks, benefits and alternatives for the proposed anesthesia with the patient or authorized representative who has indicated his/her understanding and acceptance.     Plan Discussed with: CRNA, Anesthesiologist and Surgeon  Anesthesia Plan Comments:         Anesthesia Quick Evaluation

## 2014-12-11 NOTE — Anesthesia Postprocedure Evaluation (Signed)
Anesthesia Post Note  Patient: Tamara Green  Procedure(s) Performed: Procedure(s) (LRB): CESAREAN SECTION (N/A)  Patient location during evaluation: PACU Anesthesia Type: Spinal Level of consciousness: oriented and awake and alert Pain management: pain level controlled Vital Signs Assessment: post-procedure vital signs reviewed and stable Respiratory status: spontaneous breathing and respiratory function stable Cardiovascular status: blood pressure returned to baseline and stable Postop Assessment: no headache, no backache and spinal receding Anesthetic complications: no    Last Vitals:  Filed Vitals:   12/11/14 0830 12/11/14 0853  BP: 121/59 117/56  Pulse: 108 108  Temp:  37.6 C  Resp: 29 30    Last Pain:  Filed Vitals:   12/11/14 0859  PainSc: 6                  Montez Hageman

## 2014-12-11 NOTE — Lactation Note (Signed)
This note was copied from the chart of [facility]. Lactation Consultation Note  Patient Name: Boy Jealousy Rashed [id number] Date: 12/11/2014 Reason for consult: Initial assessment Baby is 68 hours old and seen by [facility] for initial assessment. Baby was born at [redacted]w[redacted]d and birth wt was 1+15.4#. Nurse had told LC earlier that mom had a DEBP in her room but that mom was more interested in seeing baby first before pumping. When [facility] entered, mom stated that she was ready to pump. Mom has a lot of experience BF her 3 other children and has used a variety of pumps in the past (pedal pump, hand pump, lactina pump) & usually has a lot of milk; mom reports she BF her last child until he was 66 years old. Showed mom how to set up pump and had mom do some hand expression & then pump on initiate phase (no colostrum observed with pump or hand expression). Discussed frequency, set-up, and cleaning. Encouraged mom to pump every 2-3 hrs on the initiate phase until she gets 20 mL 3 times in a row & then she should pump for 15 mins every 2-3 hours & not to go longer than 4-5 hours 1 x @ night if she feels like she needs it. Encouraged mom to do hand expression for ~5 mins after pumping. Discussed how she may not see milk right away but that she needs to continue to pump & hand express for stimulation. Mom has [place]; [facility] sent a referral to [facility] for her to get a DEBP [facility] will call her or she can call them as well). Mom had NICU booklet in her room, so reviewed it & discussed milk storage guidelines. Provided BF booklet & discussed outpatient services and phone number. Encouraged mom to ask for help from nurse or to ask them to call a [facility] if she has questions later.  Maternal Data Does the patient have breastfeeding experience prior to this delivery?: Yes  Feeding    LATCH Score/Interventions                      Lactation Tools Discussed/Used Tools: Pump Breast pump type: Double-Electric Breast  Pump WIC Program: Yes Pump Review: Setup, frequency, and cleaning   Consult Status Consult Status: Follow-up Date: 12/12/14 Follow-up type: In-patient    Yvonna Alanis 12/11/2014, 3:10 PM

## 2014-12-11 NOTE — Op Note (Signed)
Preop diagnosis 25 weeks with ruptured membranes active labor and transverse lie postop diagnosis Postop diagnosis classical cesarean section Surgeon Dr. Gracy Racer Anesthesia spinal Procedure patient placed on the operating table in the supine position abdomen prepped and draped bladder emptied with a Foley catheter a transverse suprapubic incision made carried him to the rectus fascia fascia cleaned and incised length of the incision recti muscles retracted laterally peritoneum incised longitudinally was no loss sig segment so a classical incision was made on the uterus placenta was anterior the patient delivered of a female Apgars not available baby taking to MICU uterine cavity clean with dry laps the uterine incision closed in 3 layers with continuous suture of #1 chromic hemostasis satisfactory it was noted that was anterior myoma and posterior myoma lap and sponge counts correct abdomen closed in layers peritoneum continuous with of 0 chromic fascia continuous suture of 0 Dexon and the skin closed with staples blood loss was 500 cc

## 2014-12-11 NOTE — Progress Notes (Signed)
Patient ID: Tamara Green, female   DOB: Dec 28, 1980, 34 y.o.   MRN: [id number] Patient has been having mild contractions since shortly after midnight and her magnesium sulfate was restarted 4 g loading 2 g an hour and she still having mild contractions Patient wanted to see me in consult and I explained to her that were trying   get her to at least 28 weeks and so what we will do is to put a Foley catheter in increase her magnesium   to 3 g an hour for 2 hours and give her some Stadol for pain her cervix is still closed is been unchanged and the presenting part is not palpable will try to defer deliver  For as long as  possible patient understands the plan

## 2014-12-11 NOTE — Progress Notes (Signed)
Patient ID: Tamara Green, female   DOB: [birth year], 34 y.o.   MRN: [id number] Patient is now in labor contracting every 2 minutes magnesium discontinued ultrasound shows a transverse lie so patient will be delivered by cesarean section because of premature prolonged ruptured membranes active labor transverse lie presentation

## 2014-12-11 NOTE — Consult Note (Signed)
Neonatology Note:   Attendance at C-section:    I was asked by Dr. Ruthann Cancer to attend this urgent primary C/S at 25 4/7 weeks due to transverse lie and  onset of labor.. The mother is a G6P3A2 O neg, GBS not found with incompetent cervix and premature prolonged ROM. She got Betamethasone on 11/22-23 and has been on Progesterone. She has also been on Ampicillin and Zithromax since 11/30. ROM 57 hours prior to delivery, fluid clear. Mother afebrile. She was started on Magnesium sulfate at 2330 last evening and got a bolus and infusion until about 0300 today. Difficult extraction. Infant was blue, floppy, apneic, and had a HR < 100 at 1 minute. We bulb suctioned, dried, and trimmed the cord, then placed him into the portawarmer bag for temp support. I intubated him at 2 minutes of life atraumatically with a 2.5 mm ETT to a depth of 6.5 cm at the lips. The CO2 detector turned yellow slowly, with increased PIP. His HR came up to > 100 by about 2-3 minutes. We secured the ETT. Pulse oximetry showed the O2 saturation was in the 20s, so we increased the PIP with improved saturations. The O2 saturation was about 70% at 10 minutes, so we gave surfactant 2.1 ml, infused slowly over 2 minutes, which was tolerated well. His O2 saturations came up to 100% and we weaned the FIO2 gradually from 100% to 35% at transport. He was taken to the NICU being bagged by neopuff/ETT, with his father in attendance. I spoke with both parents in the DR. Ap 1/7.   Real Cons, MD

## 2014-12-11 NOTE — Anesthesia Procedure Notes (Signed)
Spinal Patient location during procedure: OR Start time: 12/11/2014 5:32 AM Staffing Anesthesiologist: Josephine Igo Performed by: anesthesiologist  Preanesthetic Checklist Completed: patient identified, site marked, surgical consent, pre-op evaluation, timeout performed, IV checked, risks and benefits discussed and monitors and equipment checked Spinal Block Patient position: sitting Prep: DuraPrep Patient monitoring: heart rate, cardiac monitor, continuous pulse ox and blood pressure Approach: midline Location: L3-4 Injection technique: single-shot Needle Needle type: Sprotte  Needle gauge: 24 G Needle length: 9 cm Needle insertion depth: 7.5 cm Assessment Sensory level: T4 Additional Notes Patient tolerated procedure well. Adequate sensory level.

## 2014-12-11 NOTE — Progress Notes (Signed)
Dr Ruthann Cancer notified of patient having repetitive variable decelerations despite position changes, that mag sulfate had been initiated as ordered, c/o pressure, and unable to palpate uc's at this time. Dr Ruthann Cancer stated he is not concerned about decels if patient is not complaining of pain due to uc's. Will continue to monitor.

## 2014-12-11 NOTE — Progress Notes (Signed)
Pt was lying in bed when I arrived; husband was bedside. She said she was doing ok and couldn't wait to go see son Oswaldo Milian (in NICU). She expressed her feelings of her birthing experience and having to have an emergency c section. She and husband expressed their strong Christian faith. In addition to [place], they have two sons and a daughter. Pt said she was feeling ok and appreciated Chaplain visit. As requested, [place] offered prayer and in return Mr. Jimmye Norman prayed for [facility]. Both were very appreciative for [facility] visit. Please page if additional support is needed. Chaplain Marlise Eves Holder   12/11/14 1000  Clinical Encounter Type  Visited With Patient and family together

## 2014-12-12 LAB — CBC
HEMATOCRIT: 24.1 % — AB (ref 36.0–46.0)
HEMOGLOBIN: 7.9 g/dL — AB (ref 12.0–15.0)
MCH: 27.4 pg (ref 26.0–34.0)
MCHC: 32.8 g/dL (ref 30.0–36.0)
MCV: 83.7 fL (ref 78.0–100.0)
Platelets: 158 10*3/uL (ref 150–400)
RBC: 2.88 MIL/uL — ABNORMAL LOW (ref 3.87–5.11)
RDW: 16.1 % — ABNORMAL HIGH (ref 11.5–15.5)
WBC: 11.7 10*3/uL — ABNORMAL HIGH (ref 4.0–10.5)

## 2014-12-12 LAB — RPR: RPR Ser Ql: NONREACTIVE

## 2014-12-12 MED ORDER — NITROFURANTOIN MONOHYD MACRO 100 MG PO CAPS
100.0000 mg | ORAL_CAPSULE | Freq: Two times a day (BID) | ORAL | Status: DC
  Administered 2014-12-12 – 2014-12-13 (×2): 100 mg via ORAL
  Filled 2014-12-12 (×2): qty 1

## 2014-12-12 NOTE — Anesthesia Postprocedure Evaluation (Signed)
Anesthesia Post Note  Patient: Tamara Green  Procedure(s) Performed: Procedure(s) (LRB): CESAREAN SECTION (N/A)  Patient location during evaluation: Women's Unit Anesthesia Type: Spinal Level of consciousness: awake and alert, oriented and patient cooperative Pain management: pain level controlled Vital Signs Assessment: post-procedure vital signs reviewed and stable Respiratory status: spontaneous breathing Cardiovascular status: stable Postop Assessment: no headache, epidural receding, patient able to bend at knees and no signs of nausea or vomiting Anesthetic complications: no    Last Vitals:  Filed Vitals:   12/12/14 0218 12/12/14 0532  BP: 117/65 103/59  Pulse: 91 86  Temp: 36.7 C 36.5 C  Resp: 18 20    Last Pain:  Filed Vitals:   12/12/14 0720  PainSc: 2                  [facility]

## 2014-12-12 NOTE — Progress Notes (Signed)
Patient ID: Tamara Green, female   DOB: 10/27/1980, 34 y.o.   MRN: [id number] Postop day 1 Blood pressure 1 03/08/1958 afebrile pulse 86 respiration 20 Abdomen soft Dressing dry Legs negative doing well

## 2014-12-12 NOTE — Addendum Note (Signed)
Addendum  created 12/12/14 [id number] by Garner Nash, CRNA   Modules edited: Clinical Notes   Clinical Notes:  File: [id number]

## 2014-12-13 ENCOUNTER — Ambulatory Visit: Payer: Medicaid Other

## 2014-12-13 ENCOUNTER — Encounter (HOSPITAL_COMMUNITY): Payer: Self-pay | Admitting: Obstetrics

## 2014-12-13 LAB — CULTURE, OB URINE: Culture: >=100000

## 2014-12-13 LAB — RH IG WORKUP (INCLUDES ABO/RH)
ABO/RH(D): O NEG
FETAL SCREEN: NEGATIVE
Gestational Age(Wks): 25.4
UNIT DIVISION: 0

## 2014-12-13 MED ORDER — CEPHALEXIN 500 MG PO CAPS
500.0000 mg | ORAL_CAPSULE | Freq: Four times a day (QID) | ORAL | Status: DC
  Administered 2014-12-13 – 2014-12-14 (×4): 500 mg via ORAL
  Filled 2014-12-13 (×5): qty 1

## 2014-12-13 NOTE — Lactation Note (Signed)
This note was copied from the chart of [facility]. Lactation Consultation Note  Patient Name: Tamara Green [id number] Date: 12/13/2014 Reason for consult: Follow-up assessment;NICU baby  NICU baby 61 hours old. Mom reports that she has not pumped since yesterday due to incisional pain. Discussed rationale for pumping early and often, and enc pumping 8 times/24 hours for 15 minutes followed by hand expression. Mom states that she has talked to [facility] about getting a DEBP, but thinks she may want to have a [facility] loaner because she doesn't believe that she can make it to [facility] office. Left paperwork for loaner with mom and enc her to try to get [facility] pump for her ultimate convenience if feeling up to it. Mom states that she doesn't think she will leave hospital for several days.  Enc mom to call for assistance as needed.  Maternal Data    Feeding    LATCH Score/Interventions                      Lactation Tools Discussed/Used     Consult Status Consult Status: Follow-up Date: 12/14/14 Follow-up type: In-patient    Inocente Salles 12/13/2014, 12:47 PM

## 2014-12-13 NOTE — Progress Notes (Signed)
Subjective: Postpartum Day 2: Cesarean Delivery Patient reports tolerating PO, + flatus and no problems voiding.    Objective: Vital signs in last 24 hours: Temp:  [98.9 F (37.2 C)-99.4 F (37.4 C)] 99.1 F (37.3 C) (12/05 0542) Pulse Rate:  [98-104] 102 (12/05 0542) Resp:  [20] 20 (12/04 2207) BP: (123-137)/(63-79) 137/79 mmHg (12/05 0542) SpO2:  [98 %-100 %] 98 % (12/05 0542)  Physical Exam:  General: alert and no distress Lochia: appropriate Uterine Fundus: firm Incision: healing well DVT Evaluation: No evidence of DVT seen on physical exam.   Recent Labs  12/11/14 0513 12/12/14 0555  HGB 9.3* 7.9*  HCT 27.9* 24.1*    Assessment/Plan: Status post Cesarean section. Doing well postoperatively.  Anemia.  Chronic iron deficiency.  Stable.  Iron Rx Continue current care.  HARPER,CHARLES A 12/13/2014, 7:20 AM

## 2014-12-14 ENCOUNTER — Telehealth: Payer: Self-pay | Admitting: *Deleted

## 2014-12-14 ENCOUNTER — Ambulatory Visit (HOSPITAL_COMMUNITY): Payer: Medicaid Other

## 2014-12-14 LAB — CBC WITH DIFFERENTIAL/PLATELET
BASOS ABS: 0 10*3/uL (ref 0.0–0.1)
BASOS PCT: 0 %
BASOS PCT: 0 %
Basophils Absolute: 0 10*3/uL (ref 0.0–0.1)
Eosinophils Absolute: 0.1 10*3/uL (ref 0.0–0.7)
Eosinophils Absolute: 0.1 10*3/uL (ref 0.0–0.7)
Eosinophils Relative: 1 %
Eosinophils Relative: 1 %
HEMATOCRIT: 26.5 % — AB (ref 36.0–46.0)
HEMATOCRIT: 24.3 % — AB (ref 36.0–46.0)
HEMOGLOBIN: 8.6 g/dL — AB (ref 12.0–15.0)
HEMOGLOBIN: 8 g/dL — AB (ref 12.0–15.0)
LYMPHS ABS: 0.9 10*3/uL (ref 0.7–4.0)
Lymphocytes Relative: 8 %
Lymphocytes Relative: 11 %
Lymphs Abs: 1 10*3/uL (ref 0.7–4.0)
MCH: 26.6 pg (ref 26.0–34.0)
MCH: 26.9 pg (ref 26.0–34.0)
MCHC: 32.5 g/dL (ref 30.0–36.0)
MCHC: 32.9 g/dL (ref 30.0–36.0)
MCV: 82 fL (ref 78.0–100.0)
MCV: 81.8 fL (ref 78.0–100.0)
MONOS PCT: 11 %
Monocytes Absolute: 1.1 10*3/uL — ABNORMAL HIGH (ref 0.1–1.0)
Monocytes Absolute: 0.9 10*3/uL (ref 0.1–1.0)
Monocytes Relative: 9 %
NEUTROS ABS: 10.3 10*3/uL — AB (ref 1.7–7.7)
NEUTROS ABS: 6.4 10*3/uL (ref 1.7–7.7)
NEUTROS PCT: 82 %
NEUTROS PCT: 77 %
Platelets: 226 10*3/uL (ref 150–400)
Platelets: 210 10*3/uL (ref 150–400)
RBC: 3.23 MIL/uL — AB (ref 3.87–5.11)
RBC: 2.97 MIL/uL — ABNORMAL LOW (ref 3.87–5.11)
RDW: 15.5 % (ref 11.5–15.5)
RDW: 15.8 % — ABNORMAL HIGH (ref 11.5–15.5)
WBC: 12.5 10*3/uL — AB (ref 4.0–10.5)
WBC: 8.3 10*3/uL (ref 4.0–10.5)

## 2014-12-14 MED ORDER — AMOXICILLIN-POT CLAVULANATE 875-125 MG PO TABS
1.0000 | ORAL_TABLET | Freq: Two times a day (BID) | ORAL | Status: DC
  Filled 2014-12-14: qty 1

## 2014-12-14 MED ORDER — DEXTROSE 5 % IV SOLN
2.0000 g | Freq: Four times a day (QID) | INTRAVENOUS | Status: DC
  Administered 2014-12-14 – 2014-12-18 (×15): 2 g via INTRAVENOUS
  Filled 2014-12-14 (×17): qty 2

## 2014-12-14 MED ORDER — FERROUS SULFATE 325 (65 FE) MG PO TABS
325.0000 mg | ORAL_TABLET | Freq: Three times a day (TID) | ORAL | Status: DC
Start: 2014-12-14 — End: 2014-12-18
  Administered 2014-12-14 – 2014-12-17 (×10): 325 mg via ORAL
  Filled 2014-12-14 (×10): qty 1

## 2014-12-14 NOTE — Progress Notes (Signed)
CSW attempted to meet with MOB to complete assessment due to NICU admission at 24.5 weeks, but she was not in her room at this time.  CSW will attempt again at a later time.

## 2014-12-14 NOTE — Progress Notes (Signed)
Dr Jodi Mourning  Notified of temp elevation and  Orders recieved

## 2014-12-14 NOTE — Lactation Note (Signed)
This note was copied from the chart of [facility]. Lactation Consultation Note  Follow up visit made.  Mom states she has not obtained any milk but breast are feeling fuller.  She has breastfed 3 other babies and excited to provide milk for her infant son.  Reinforced importance of pumping every 3 hours for breast stimulation.  She plans to rent a pump prior to discharge.  Patient Name: Tamara Green [id number] Date: 12/14/2014     Maternal Data    Feeding    LATCH Score/Interventions                      Lactation Tools Discussed/Used     Consult Status      Ave Filter 12/14/2014, 2:41 PM

## 2014-12-14 NOTE — Telephone Encounter (Signed)
I spoke to Ms. Carr-Williams on 12/06/2014 concerning her conversation with Barb. The patient was upset that Raford Pitcher would pull Dr. Jodi Mourning out of a room to discuss ultrasound results. Dr. Jodi Mourning spoke to the patient on 12/01/2014, the same day Ms. Robina Ade- Jimmye Norman called in to discuss the results.

## 2014-12-14 NOTE — Clinical Social Work Maternal (Signed)
CLINICAL SOCIAL WORK MATERNAL/CHILD NOTE  Patient Details  Name: Tamara Green MRN: [id number] Date of Birth: 1980/03/07  Date:  12/14/2014  Clinical Social Worker Initiating Note:  Rawson Minix E. Brigitte Pulse, [place] Date/ Time Initiated:  12/14/14/1130     Child's Name:  Karie Schwalbe   Legal Guardian:   (Parents: Jari Sportsman and Haywood Lasso "Gaetana Michaelis)   Need for Interpreter:  None   Date of Referral:        Reason for Referral:   (No referral-NICU admission at 25.4 weeks)   Referral Source:      Address:  [address] [place], [place] [address]  Phone number:  [phone]   Household Members:  Spouse, Minor Children (Parents have three children at home: Roderick/son/age 10, Miracle/daughter/age 23 and Skyler/son/age 25)   Natural Supports (not living in the home):  Immediate Family, Extended Family, Friends, Tax adviser: None   Employment:     Type of Work:  (MOB works in [employer] for [employer] and as a Scientist, physiological at [employer].  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  MOB states a strong faith and support from her church family.  Strengths:  Ability to meet basic needs , Compliance with medical plan , Understanding of illness, Pediatrician chosen  (Pediatric follow up will be with Dr. Loma Sousa.  CSW did not discuss prepartations for baby at this time.)   Risk Factors/Current Problems:  None   Cognitive State:  Alert , Linear Thinking , Goal Oriented , Insightful    Mood/Affect:  Calm , Interested , Other (Comment) (MOB began having chills and not feeling well)   CSW Assessment: CSW met with MOB in her third floor room/303 to introduce services, offer support and complete assessment due to baby's admission to NICU at 25.4 weeks.  MOB was extremely  pleasant and welcoming of CSW's visit.  She reports feeling very sore, but otherwise well.  She seemed open to talking, but CSW was unable to complete the full assessment because MOB developed chills and appeared to not be feeling well.   MOB spoke about her hopes that her 56 year/Skyler old will get to meet his brother, since he has not been able to visit as her 13/Roderick and 34 year old/Miracle.  She states this has been hard, but that she has talked to her children and feel they understand both why the 22 year old sibling cannot come into the unit as well as how premature and critical their brother is.  MOB reports that baby is more "irritated" today, but that he is "stable."  MOB shared about each one of her pregnancies.  She states her first child was born at 70 weeks after 10 weeks on bed rest in the hospital.  She states her second two children were born at term and that she had cerclages and took 17P with all of her pregnancies.  MOB spoke at length about her first two pregnancies, which ended in losses at 10 and 20 weeks.  Her first child was a boy, whom she named Psychologist, prison and probation services.  Her second child was a girl, whom she named Antasia.  She states she became very depressed after these losses (when she was 17 and 18) and that her friends kept her occupied to keep her mind off  of it.  She states she met her husband and became a [place] around age 61 and that this was life changing for her.  She states she has now found peace with the losses of her first two children.  CSW acknowledged MOB of being a mother of 6.  She smiled and seemed to appreciate this. MOB told CSW that everything had been going very well during this pregnancy until her water broke at church on Wednesday.  She states that her husband and church family helped her get to the hospital and sent her out with prayer.  It is apparent that prayer and faith are very important to the family.  MOB states she felt guilty at the beginning of the pregnancy  because it was unplanned and she was "sad" when she found out.  She had feelings that this pregnancy was going to set her back in her efforts to move up in her career.  CSW thanked her for being honest with herself and validated these feelings.  MOB states she came to terms with the pregnancy once her nausea was under control and she heard her baby's heartbeat.  She states she bonded with baby in utero and that he was very active.  She reports that she tries to visit him twice a day, but that sometimes it is difficult because of how much pain she is in.  She is worried that she will need a day at home to recuperate after her discharge before returning to the hospital to see baby.  CSW asked her to take care of herself and find give herself time to find a balance between taking care of herself and being with baby at the hospital.  CSW asked her not to feel guilty if she needs to stay home to rest.  She reports that she has family and friends who can help transport her to the hospital until she can drive.  In addition to her church family, MOB reports that her dad and all of FOB's family are supportive.   MOB is concerned about the amount of stairs she has at home and would like to be able to stay on the ground level of her home until she has healed from surgery.  She reports there is no bathroom on the first floor and wonders about obtaining a bedside toilet.  She states she has called friends to see if anyone has one.  CSW left message for Case Manager to inquire and plans to call local medical supply stores to inquire about the cost of purchase.  MOB was very Patent attorney.   CSW helped MOB into her bed and asked the NT for a warm blanket as MOB began to shake from chills.  CSW thanked MOB for sharing with CSW and offered to return when she is feeling better.  MOB smiled and thanked CSW.  Upon return, CSW plans to discuss signs and symptoms of perinatal mood disorders and inform MOB of baby's eligibility for  [employer] Income.    CSW Plan/Description:  Psychosocial Support and Ongoing Assessment of Needs    Kalman Shan 12/14/2014, 1:29 PM

## 2014-12-14 NOTE — Progress Notes (Addendum)
Patient ID: Einar Gip, female   DOB: 07-Nov-1980, 34 y.o.   MRN: [id number]  Subjective: POD #3 s/p LTC/S  Indication:primary c-section, with transverse lie and laboring with fetal prematurity.   Patient reports tolerating PO.  Denies HA/SOB/C/P/N/V/dizziness.  Reports flatus or BM. Breast symptoms: No.  She reports vaginal bleeding as normal, without clots.  She is ambulating, urinating without difficulty.     Objective: Vital signs in last 24 hours: BP 123/71 mmHg  Pulse 102  Temp(Src) 99.2 F (37.3 C) (Oral)  Resp 18  Ht 5\' 8"  (1.727 m)  Wt 266 lb (120.657 kg)  BMI 40.45 kg/m2  SpO2 100%  LMP 06/15/2014  Breastfeeding? Unknown  Physical Exam:  General: alert, cooperative, no distress and morbidly obese CV: Regular rate and rhythm, S1S2 present or without murmur or extra heart sounds Resp: clear Abdomen: soft, nontender, normal bowel sounds Lochia: minimal Uterine Fundus: firm, below umbilicus, tender Incision: clean, dry and intact Ext: extremities normal, atraumatic, no cyanosis or edema, Homans sign is negative, no sign of DVT and no edema, redness or tenderness in the calves or thighs  Recent Labs  12/12/14 0555  HGB 7.9*  HCT 24.1*    Assessment/Plan: 35 y.o. status post Cesarean section POD# 3.  Postpartum fever, hx of prolonged rupture of membranes Fundal tenderness Anemia  Routine post-op care   Kimberlye Dilger A Lakeva Hollon 12/14/2014, 9:11 AM

## 2014-12-15 NOTE — Lactation Note (Signed)
This note was copied from the chart of [facility]. Lactation Consultation Note  Follow up note.  Mom states she is not obtaining milk yet but had an abundant supply with previous babies so not concerned.  Reinforced importance of pumping every 3 hours regardless of what she is obtaining.  Reminded to change pump function to standard setting when obtaining 15-20 mls.  No questions at present.  Patient Name: Tamara Green [id number] Date: 12/15/2014     Maternal Data    Feeding    LATCH Score/Interventions                      Lactation Tools Discussed/Used     Consult Status      Ave Filter 12/15/2014, 11:40 AM

## 2014-12-15 NOTE — Progress Notes (Signed)
I offered emotional and spiritual support to pt and her husband, [name].  They are feeling positive at this time and they are relying on their faith to keep their spirits strong right now.  I offered prayer, at their request and they found this to be very spiritually uplifting.  Tamara Green shared about her experience of being hospitalized for 10 weeks in her first pregnancy (14 years ago) and how she relied on spiritual support to maintain a positive attitude.  They are aware of ongoing availability of chaplain support.  Please page as needs arise or as family requests.   Chaplain Janne Napoleon, [place] Pager, [phone] 12:41 PM    12/15/14 1200  Clinical Encounter Type  Visited With Patient  Visit Type Spiritual support  Referral From Nurse

## 2014-12-15 NOTE — Progress Notes (Signed)
CSW called Dr. Charlean Merl regarding MOB's concern for climbing stairs at home to use the bathroom and request for bedside toilet.  Dr. Jodi Mourning agreeable to write order and asked that Care Manager contact him.  CSW spoke with L. Government social research officer to discuss.

## 2014-12-15 NOTE — Care Management Note (Signed)
Case Management Note  Patient Details  Name: Tamara Green MRN: [id number] Date of Birth: 1980-08-12                Post Acute Care Choice:  Durable Medical Equipment    DME Arranged:  Bedside commode DME Agency:  [facility] [facility].     Status of Service:   completed      Additional Comments:   CM received call from [place] stating patient has a lot of stairs and that  she spoke to physician and patient needing a beside commode at home for home use (dme).  CM called Dr. Jodi Mourning and he gave telephone order for patient to have bedside commode for home use.  CM met with patient in room and discussed plan and she in agreement with plan.  Referral sent to Cherylynn Ridges # [phone] with [facility] and she stated she would have beside commode delivered to room either this evening or in am prior to patient going home.  No other needs identifed at this time.   Yong Channel, RN 12/15/2014, 11:48 AM

## 2014-12-15 NOTE — Progress Notes (Signed)
Patient ID: Tamara Green, female   DOB: 09-May-1980, 34 y.o.   MRN: [id number]  Subjective: POD# 4 s/p Cesarean Delivery.  Indications: transverse fetal lie, laboring with cerclage at 35 4/7 EGA, was on Mag.   RH status/[place] reviewed. Feeding: breast Patient reports tolerating PO.  Denies HA/SOB/C/P/N/V/dizziness.  Reports flatus or BM. Breast symptoms: No..  She reports vaginal bleeding as normal, without clots.  She is ambulating, urinating without difficulty.  States that her abdomen is feeling better.  Stable temperatures after IV antibiotics.    Objective: Vital signs in last 24 hours: BP 131/80 mmHg  Pulse 105  Temp(Src) 98.1 F (36.7 C) (Oral)  Resp 20  Ht 5\' 8"  (1.727 m)  Wt 266 lb (120.657 kg)  BMI 40.45 kg/m2  SpO2 99%  LMP 06/15/2014  Breastfeeding? Unknown       Physical Exam:  General: alert, cooperative and no distress CV: Regular rate and rhythm or S1S2 present, + grade 2-3 murmur Resp: clear Abdomen: soft, nontender, normal bowel sounds Lochia: minimal Uterine Fundus: firm, below umbilicus, tender Incision: clean, dry and intact Ext: extremities normal, atraumatic, no cyanosis or edema, Homans sign is negative, no sign of DVT and no edema, redness or tenderness in the calves or thighs    Recent Labs  12/14/14 0959 12/14/14 1904  HGB 8.6* 8.0*  HCT 26.5* 24.3*      Assessment/Plan: 34 y.o.  status post Cesarean section. POD# 4.   Anemia ?Endometritis Improving, stable.              Advance diet as tolerated Start po pain meds Ambulate IS Routine post-op care  Nolita Kutter A Katelinn Justice 12/15/2014, 8:49 AM

## 2014-12-16 NOTE — Lactation Note (Signed)
This note was copied from the chart of [facility]. Lactation Consultation Note  Patient Name: Tamara Green [id number] Date: 12/16/2014 Reason for consult: Follow-up assessment;NICU baby;Infant < 6lbs   Follow up with mom of NICU baby now 36 days old. Mom is pumping and hand expressing about every 3 hours. She is getting 3-5 cc each pumping and taking to infant in NICU. Enc mom to pump every 2-3 hours for 15 minutes followed by hand expression. Mom reports she pumped when she was visiting baby and didn't get as much, enc her to cover pump and to try relaxation techniques with pumping to maximize milk flow. Mom voiced understanding. Plans to do a [facility] rental prior to D/C. May be a patient for a few more days per mom. Enc mom to call with questions/concerns.    Maternal Data Formula Feeding for Exclusion: No Has patient been taught Hand Expression?: Yes Does the patient have breastfeeding experience prior to this delivery?: Yes  Feeding    LATCH Score/Interventions                      Lactation Tools Discussed/Used WIC Program: Yes Pump Review: Setup, frequency, and cleaning;Milk Storage   Consult Status Consult Status: Follow-up Date: 12/17/14 Follow-up type: In-patient    Debby Freiberg Lonza Shimabukuro 12/16/2014, 1:04 PM

## 2014-12-16 NOTE — Progress Notes (Signed)
Patient stated pain med does not work (Vicodin) but patient was snoring before waking patient up for vitals and meds this am

## 2014-12-16 NOTE — Progress Notes (Addendum)
CSW saw parents leaving the NICU and spoke with them to offer ongoing support.  MOB introduced CSW to FOB, who is pleasant and seems very caring toward MOB.  MOB thanked CSW for the assistance in getting a bedside toilet arranged for home.  She reports that this is a huge relief to her, as she was extremely concerned about climbing the stairs every time she needed to use the bathroom.  Parents feel they are coping appropriately with baby's critical state at this time and relying on faith to get them through.

## 2014-12-16 NOTE — Progress Notes (Signed)
Subjective: Postpartum Day 5: Cesarean Delivery Patient reports tolerating PO, + flatus and no problems voiding.    Objective: Vital signs in last 24 hours: Temp:  [98.2 F (36.8 C)-99 F (37.2 C)] 98.4 F (36.9 C) (12/08 0515) Pulse Rate:  [93-106] 93 (12/08 0515) Resp:  [19-20] 19 (12/08 0515) BP: (127-139)/(61-79) 139/78 mmHg (12/08 0515) SpO2:  [96 %-100 %] 97 % (12/08 0515)  Physical Exam:  General: alert and no distress Lochia: appropriate Uterine Fundus: firm, mild tenderness Incision: healing well DVT Evaluation: No evidence of DVT seen on physical exam.   Recent Labs  12/14/14 0959 12/14/14 1904  HGB 8.6* 8.0*  HCT 26.5* 24.3*    Assessment/Plan: Status post Cesarean section.  Endometritis.  Improving. Anemia.  Chronic iron deficiency.  Stable clinically.  Iron Rx Continue current care.  HARPER,CHARLES A 12/16/2014, 8:30 AM

## 2014-12-17 LAB — CBC WITH DIFFERENTIAL/PLATELET
Basophils Absolute: 0 10*3/uL (ref 0.0–0.1)
Basophils Relative: 0 %
EOS ABS: 0.2 10*3/uL (ref 0.0–0.7)
EOS PCT: 2 %
HCT: 23.6 % — ABNORMAL LOW (ref 36.0–46.0)
HEMOGLOBIN: 7.8 g/dL — AB (ref 12.0–15.0)
LYMPHS ABS: 1.3 10*3/uL (ref 0.7–4.0)
LYMPHS PCT: 15 %
MCH: 27 pg (ref 26.0–34.0)
MCHC: 33.1 g/dL (ref 30.0–36.0)
MCV: 81.7 fL (ref 78.0–100.0)
MONOS PCT: 9 %
Monocytes Absolute: 0.8 10*3/uL (ref 0.1–1.0)
Neutro Abs: 6.3 10*3/uL (ref 1.7–7.7)
Neutrophils Relative %: 73 %
PLATELETS: 272 10*3/uL (ref 150–400)
RBC: 2.89 MIL/uL — AB (ref 3.87–5.11)
RDW: 15.5 % (ref 11.5–15.5)
WBC: 8.6 10*3/uL (ref 4.0–10.5)

## 2014-12-17 MED ORDER — FUSION PLUS PO CAPS: 1.0000 | ORAL_CAPSULE | Freq: Every day | ORAL | Status: DC

## 2014-12-17 MED ORDER — HYDROCODONE-ACETAMINOPHEN 5-325 MG PO TABS: 1.0000 | ORAL_TABLET | ORAL | Status: DC | PRN

## 2014-12-17 MED ORDER — CEFUROXIME AXETIL 500 MG PO TABS: 500.0000 mg | ORAL_TABLET | Freq: Two times a day (BID) | ORAL | Status: DC

## 2014-12-17 MED ORDER — IBUPROFEN 600 MG PO TABS: 600.0000 mg | ORAL_TABLET | Freq: Four times a day (QID) | ORAL | Status: DC | PRN

## 2014-12-17 NOTE — Progress Notes (Signed)
Subjective: Postpartum Day 5: Cesarean Delivery Patient reports tolerating PO, + flatus, + BM and no problems voiding.    Objective: Vital signs in last 24 hours: Temp:  [98.2 F (36.8 C)-99.2 F (37.3 C)] 98.2 F (36.8 C) (12/09 0534) Pulse Rate:  [82-97] 82 (12/09 0534) Resp:  [14-22] 14 (12/09 0534) BP: (118-140)/(64-84) 125/78 mmHg (12/09 0534) SpO2:  [94 %-100 %] 94 % (12/09 0534)  Physical Exam:  General: alert and no distress Lochia: appropriate Uterine Fundus: firm, mildly tender Incision: healing well DVT Evaluation: No evidence of DVT seen on physical exam.   Recent Labs  12/14/14 0959 12/14/14 1904  HGB 8.6* 8.0*  HCT 26.5* 24.3*    Assessment/Plan: Status post Cesarean section.  Postpartum endometritis.  Improving on Mefoxin.   Continue current care.  Melita Villalona A 12/17/2014, 9:03 AM

## 2014-12-17 NOTE — Progress Notes (Signed)
I spent time with Tamara Green in the NICU with her baby, Tamara Green.  She is relying on her faith and also using good coping skills to manage her stress.  We spoke about coordinating sharing information about Tamara Green so that not everyone is calling her regularly since that is wearing on her.  She and [name] are supporting each other well, though he is also caring for the other children and working as well. She is appreciative of the support she is receiving.  [place], [place] Pager, [phone] 2:47 PM    12/17/14 1400  Clinical Encounter Type  Visited With Patient  Visit Type Spiritual support

## 2014-12-17 NOTE — Lactation Note (Signed)
This note was copied from the chart of [facility]. Lactation Consultation Note  Patient Name: Tamara Green [id number] Date: 12/17/2014 Reason for consult: Follow-up assessment     With this mom of a NICU baby, now 29 days old, and 26 3/7 weeks CGA. Mom should be discharged to home tomorrow, and plans to do a [place] on discharge to home. Mom has the paper work. Mom is pumping up to 70 ml's at a time now. Mom encouraged to pump at least 8 times a day, every 2-3 hours. Mom knows to call for questions/conerns.  Maternal Data    Feeding    LATCH Score/Interventions                      Lactation Tools Discussed/Used WIC Program: Yes   Consult Status Consult Status: Follow-up Date: 12/18/14 Follow-up type: In-patient    [name] 12/17/2014, 2:59 PM

## 2014-12-17 NOTE — Progress Notes (Signed)
CSW met with parents in MOB's third floor room to offer continued support and evaluate how they are coping at this point in both baby's and MOB's hospitalizations.  Parents were pleasant and welcoming of CSW's visit.  FOB was quiet, but engaged.  MOB states she is feeling much better and thankful for that.   CSW informed parents of baby's eligibility for Supplemental Security Income benefits through the [place] and answered related questions.  MOB chose to sign authorization to release protected health information in order to obtain a copy of baby's admission summary to take to [facility] in the event they choose to apply.  CSW provided copy and placed authorization in baby's paper chart.   CSW inquired about their emotions and how they feel they are coping at this time, as well as the importance of allowing and addressing emotions.  MOB states she broke down this morning and found the chapel downstairs.  She states she was feeling overwhelmed with emotion and that spending an hour with God is just what she needed.  CSW provided education on perinatal mood disorders and acknowledged the added stress and emotions of an extremely premature birth/NICU admission.  CSW encouraged parents to talk with an MD and or CSW if they have concerns about their mental health at any point in time.  MOB commits to this and states that she has a wonderful support system and knows to take time to spend time with God each day.   Parents seemed very appreciative of CSW's visit and concern for their emotional wellbeing.

## 2014-12-18 MED ORDER — MEASLES, MUMPS & RUBELLA VAC ~~LOC~~ INJ
0.5000 mL | INJECTION | Freq: Once | SUBCUTANEOUS | Status: AC
  Administered 2014-12-18: 0.5 mL via SUBCUTANEOUS
  Filled 2014-12-18: qty 0.5

## 2014-12-18 NOTE — Progress Notes (Signed)

## 2014-12-18 NOTE — Progress Notes (Signed)
Spoke w/ mother over the phone.  Mother does not want a [place] at this time.  Suggest she use 2 manual pumps and ask [facility] if they have a DEBP she can use. Reminded her to take her pump parts.  Mother seemed satisfied over phone with plan.

## 2014-12-18 NOTE — Discharge Summary (Signed)
Obstetric Discharge Summary Reason for Admission: onset of labor Prenatal Procedures: cerclage and ultrasound Intrapartum Procedures: cesarean: classical Postpartum Procedures: antibiotics Complications-Operative and Postpartum: none HEMOGLOBIN  Date Value Ref Range Status  12/17/2014 7.8* 12.0 - 15.0 g/dL Final   HCT  Date Value Ref Range Status  12/17/2014 23.6* 36.0 - 46.0 % Final   HEMATOCRIT  Date Value Ref Range Status  02/03/2014 23.0* 34.0 - 46.6 % Final    Physical Exam:  General: alert and no distress Lochia: appropriate Uterine Fundus: firm Incision: healing well DVT Evaluation: No evidence of DVT seen on physical exam.  Discharge Diagnoses: Preterm pregnanccy, delivered by Classical C/S for Transverse Lie at 25 weeks  Discharge Information: Date: 12/18/2014 Activity: pelvic rest Diet: routine Medications: PNV, Ibuprofen, Colace, Iron and Vicodin Condition: stable Instructions: refer to practice specific booklet Discharge to: home Follow-up Information    Follow up with Sherwin Hollingshed A, MD. Schedule an appointment as soon as possible for a visit in 2 weeks.   Specialty:  Obstetrics and Gynecology   Contact information:   [address] [place] [address] [phone]       Newborn Data: Live born female  Birth Weight: 1 lb 15.4 oz (890 g) APGAR: 1, 7  Baby in NICU  Kalyna Paolella A 12/18/2014, 2:44 PM

## 2014-12-19 LAB — CULTURE, BLOOD (ROUTINE X 2)
CULTURE: NO GROWTH
Culture: NO GROWTH

## 2014-12-19 LAB — CULTURE, BLOOD (SINGLE): CULTURE: NO GROWTH

## 2014-12-20 ENCOUNTER — Encounter: Payer: Medicaid Other | Admitting: Obstetrics

## 2014-12-20 ENCOUNTER — Other Ambulatory Visit: Payer: Medicaid Other

## 2014-12-27 ENCOUNTER — Ambulatory Visit: Payer: Medicaid Other

## 2014-12-28 ENCOUNTER — Ambulatory Visit: Payer: Medicaid Other | Admitting: Obstetrics

## 2014-12-28 ENCOUNTER — Ambulatory Visit (HOSPITAL_COMMUNITY): Payer: Medicaid Other

## 2014-12-28 ENCOUNTER — Ambulatory Visit (INDEPENDENT_AMBULATORY_CARE_PROVIDER_SITE_OTHER): Payer: Medicaid Other | Admitting: Obstetrics

## 2014-12-28 DIAGNOSIS — O3430 Maternal care for cervical incompetence, unspecified trimester: Secondary | ICD-10-CM | POA: Diagnosis not present

## 2014-12-28 DIAGNOSIS — G8918 Other acute postprocedural pain: Secondary | ICD-10-CM

## 2014-12-28 MED ORDER — HYDROCODONE-ACETAMINOPHEN 5-325 MG PO TABS: 1.0000 | ORAL_TABLET | ORAL | Status: DC | PRN

## 2014-12-28 NOTE — Progress Notes (Signed)
Subjective:     Tamara Green is a 34 y.o. female who presents for a postpartum visit. She is 2 weeks postpartum following a Classical C/S. I have fully reviewed the prenatal and intrapartum course. The delivery was at 25 gestational weeks. Outcome: primary cesarean section, classical incision. Anesthesia: spinal. Postpartum course has been normal. Baby's course has been normal. Baby is feeding by breast. Bleeding thin lochia. Bowel function is normal. Bladder function is normal. Patient is not sexually active. Contraception method is abstinence. Postpartum depression screening: negative.  Tobacco, alcohol and substance abuse history reviewed.  Adult immunizations reviewed including TDAP, rubella and varicella.  The following portions of the patient's history were reviewed and updated as appropriate: allergies, current medications, past family history, past medical history, past social history, past surgical history and problem list.  Review of Systems A comprehensive review of systems was negative.   Objective:    BP 137/96 mmHg  Pulse 80  Wt 246 lb (111.585 kg)  General:  alert and no distress   Breasts:  inspection negative, no nipple discharge or bleeding, no masses or nodularity palpable  Lungs: clear to auscultation bilaterally  Heart:  regular rate and rhythm, S1, S2 normal, no murmur, click, rub or gallop  Abdomen: soft, non-tender; bowel sounds normal; no masses,  no organomegaly and incision C, D, I and nontender.   Vulva:  normal  Vagina: normal vagina  Cervix:  no cervical motion tenderness.  Cerclage grasped with long dressing forceps and removed, intact, with long Mayo scissors.  Corpus: normal size, contour, position, consistency, mobility, non-tender  Adnexa:  no mass, fullness, tenderness  Rectal Exam: Not performed.           Assessment:     Normal postpartum exam. Pap smear not done at today's visit.     H/O of Incompetent Cervix.  S/P Classical C/S in  labor.  Cerclage left in place.  Contraceptive counseling and advice Plan:    1. Contraception: abstinence   2.Cerclage removed, intact  3.Contraceptive options discussed  4.. Follow up in: 6 weeks or as needed.   Healthy lifestyle practices reviewed

## 2014-12-29 ENCOUNTER — Encounter: Payer: Self-pay | Admitting: Obstetrics

## 2015-01-01 ENCOUNTER — Other Ambulatory Visit: Payer: Self-pay | Admitting: Obstetrics

## 2015-01-01 DIAGNOSIS — B9689 Other specified bacterial agents as the cause of diseases classified elsewhere: Secondary | ICD-10-CM

## 2015-01-01 DIAGNOSIS — N760 Acute vaginitis: Principal | ICD-10-CM

## 2015-01-01 LAB — SURESWAB, VAGINOSIS/VAGINITIS PLUS
ATOPOBIUM VAGINAE: NOT DETECTED Log (cells/mL)
C. ALBICANS, DNA: NOT DETECTED
C. PARAPSILOSIS, DNA: NOT DETECTED
C. TRACHOMATIS RNA, TMA: NOT DETECTED
C. glabrata, DNA: NOT DETECTED
C. tropicalis, DNA: NOT DETECTED
GARDNERELLA VAGINALIS: 5 Log (cells/mL)
LACTOBACILLUS SPECIES: NOT DETECTED Log (cells/mL)
MEGASPHAERA SPECIES: 5.7 Log (cells/mL)
N. gonorrhoeae RNA, TMA: NOT DETECTED
T. VAGINALIS RNA, QL TMA: NOT DETECTED

## 2015-01-01 MED ORDER — METRONIDAZOLE 500 MG PO TABS: 500.0000 mg | ORAL_TABLET | Freq: Two times a day (BID) | ORAL | Status: DC

## 2015-01-04 ENCOUNTER — Ambulatory Visit: Payer: Medicaid Other

## 2015-01-11 ENCOUNTER — Ambulatory Visit: Payer: Medicaid Other

## 2015-02-08 ENCOUNTER — Ambulatory Visit (INDEPENDENT_AMBULATORY_CARE_PROVIDER_SITE_OTHER): Payer: Medicaid Other | Admitting: Obstetrics

## 2015-02-08 ENCOUNTER — Encounter: Payer: Self-pay | Admitting: Obstetrics

## 2015-02-08 NOTE — Progress Notes (Signed)
Subjective:     Tamara Green is a 35 y.o. female who presents for a postpartum visit. She is 8 weeks postpartum following a fundal vertical Cesarean section. I have fully reviewed the prenatal and intrapartum course. The delivery was at 25 gestational weeks. Outcome: primary cesarean section, classical incision. Anesthesia: spinal. Postpartum course has been normal. Baby's course has been in NICU. Baby is feeding by bottle - formula. Bleeding no bleeding. Bowel function is normal. Bladder function is normal. Patient is sexually active. Contraception method is condoms. Postpartum depression screening: negative.  Tobacco, alcohol and substance abuse history reviewed.  Adult immunizations reviewed including TDAP, rubella and varicella.  The following portions of the patient's history were reviewed and updated as appropriate: allergies, current medications, past family history, past medical history, past social history, past surgical history and problem list.  Review of Systems A comprehensive review of systems was negative.   Objective:    BP 157/80 mmHg  Pulse 87  General:  alert and no distress   Breasts:  inspection negative, no nipple discharge or bleeding, no masses or nodularity palpable  Lungs: clear to auscultation bilaterally  Heart:  regular rate and rhythm, S1, S2 normal, no murmur, click, rub or gallop  Abdomen: normal findings: soft, non-tender.  Incision C, D, I.   Vulva:  normal  Vagina: normal vagina, no discharge, exudate, lesion, or erythema  Cervix:  no cervical motion tenderness  Corpus: normal size, contour, position, consistency, mobility, non-tender  Adnexa:  no mass, fullness, tenderness  Rectal Exam: Not performed.           Assessment:     Normal postpartum exam. Pap smear not done at today's visit.    Contraceptive counseling and advice.  Wants IUD.  Plan:    1. Contraception: IUD 2. Mirena IUD Rx 3. Follow up in: 1 week or as needed.    Healthy lifestyle practices reviewed

## 2015-02-12 ENCOUNTER — Other Ambulatory Visit: Payer: Self-pay | Admitting: Obstetrics

## 2015-02-12 DIAGNOSIS — B9689 Other specified bacterial agents as the cause of diseases classified elsewhere: Secondary | ICD-10-CM

## 2015-02-12 DIAGNOSIS — N760 Acute vaginitis: Principal | ICD-10-CM

## 2015-02-12 LAB — SURESWAB BACTERIAL VAGINOSIS/ITIS
ATOPOBIUM VAGINAE: NOT DETECTED Log (cells/mL)
C. ALBICANS, DNA: NOT DETECTED
C. PARAPSILOSIS, DNA: NOT DETECTED
C. glabrata, DNA: NOT DETECTED
C. tropicalis, DNA: NOT DETECTED
Gardnerella vaginalis: >8 Log (cells/mL)
LACTOBACILLUS SPECIES: NOT DETECTED Log (cells/mL)
MEGASPHAERA SPECIES: NOT DETECTED Log (cells/mL)
T. vaginalis RNA, QL TMA: NOT DETECTED

## 2015-02-12 MED ORDER — METRONIDAZOLE 500 MG PO TABS: 500.0000 mg | ORAL_TABLET | Freq: Two times a day (BID) | ORAL | Status: DC

## 2015-02-17 ENCOUNTER — Telehealth: Payer: Self-pay | Admitting: *Deleted

## 2015-02-17 NOTE — Telephone Encounter (Signed)
Patient interested in the Mirena IUD for contraception and has been scheduled for 02-24-15 @ 1 pm for the insertion. Patient has been sexually active since delivery but has been using condoms. Patient advised to abstain from intercourse until after insertion of the Mirena. Patient verbalized understanding.

## 2015-02-24 ENCOUNTER — Encounter: Payer: Self-pay | Admitting: Obstetrics

## 2015-02-24 ENCOUNTER — Ambulatory Visit (INDEPENDENT_AMBULATORY_CARE_PROVIDER_SITE_OTHER): Payer: Medicaid Other | Admitting: Obstetrics

## 2015-02-24 VITALS — BP 143/77 | HR 82 | Wt 251.0 lb

## 2015-02-24 DIAGNOSIS — Z30430 Encounter for insertion of intrauterine contraceptive device: Secondary | ICD-10-CM | POA: Diagnosis not present

## 2015-02-24 DIAGNOSIS — Z01818 Encounter for other preprocedural examination: Secondary | ICD-10-CM

## 2015-02-24 NOTE — Progress Notes (Signed)
IUD Insertion Procedure Note  Pre-operative Diagnosis: Desire long term contraception  Post-operative Diagnosis: same  Indications: contraception  Procedure Details  Urine pregnancy test was done in office and result was negative.  The risks (including infection, bleeding, pain, and uterine perforation) and benefits of the procedure were explained to the patient and Written informed consent was obtained.    Cervix cleansed with Betadine. Uterus sounded to 9 cm. IUD inserted without difficulty. String visible and trimmed. Patient tolerated procedure well.  IUD Information: Mirena, Lot # [id number], Expiration date 45 / 41.  Condition: Stable  Complications: None  Plan:  The patient was advised to call for any fever or for prolonged or severe pain or bleeding. She was advised to use NSAID as needed for mild to moderate pain.   Attending Physician Documentation: I was present for or participated in the entire procedure, including opening and closing.

## 2015-03-04 ENCOUNTER — Telehealth: Payer: Self-pay

## 2015-03-04 NOTE — Telephone Encounter (Signed)
need order put in for her u/s on 3/2 for IUD - Thanks!

## 2015-03-04 NOTE — Telephone Encounter (Signed)
Please put order in- for here to follow her insertion

## 2015-03-05 ENCOUNTER — Other Ambulatory Visit: Payer: Self-pay | Admitting: Obstetrics

## 2015-03-05 DIAGNOSIS — Z30430 Encounter for insertion of intrauterine contraceptive device: Secondary | ICD-10-CM

## 2015-03-10 ENCOUNTER — Other Ambulatory Visit: Payer: Medicaid Other

## 2015-03-10 ENCOUNTER — Ambulatory Visit: Payer: Medicaid Other | Admitting: Obstetrics

## 2015-04-07 ENCOUNTER — Ambulatory Visit: Payer: Medicaid Other | Admitting: Obstetrics

## 2016-11-02 IMAGING — US US OB COMP LESS 14 WK
1 series · 13 of 28 positions shown · non-contrast
Comparison: None.

CLINICAL DATA: Vaginal bleeding. First trimester pregnancy. Patient
is 9 weeks and 6 days pregnant based on her last menstrual period.

EXAM:
OBSTETRIC <14 WK ULTRASOUND
TECHNIQUE: Transabdominal ultrasound was performed for evaluation of the
gestation as well as the maternal uterus and adnexal regions.

[Series 1: us ob follow up · 13 of 40 slices shown]
[im 2/40]
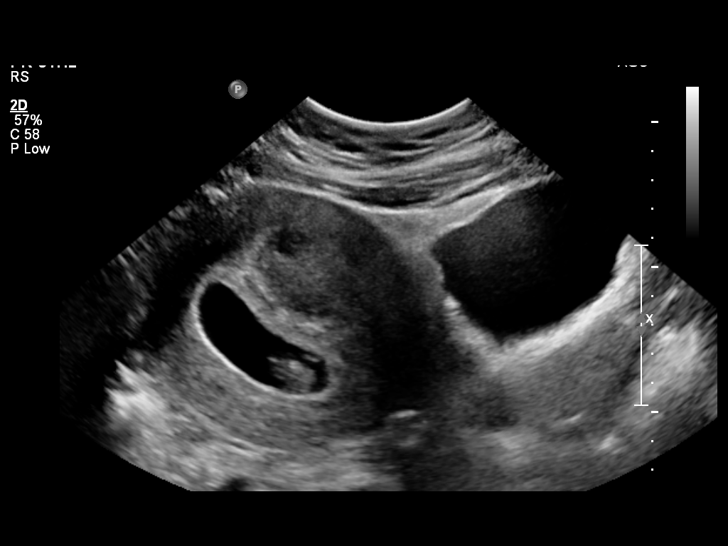
[im 5/40]
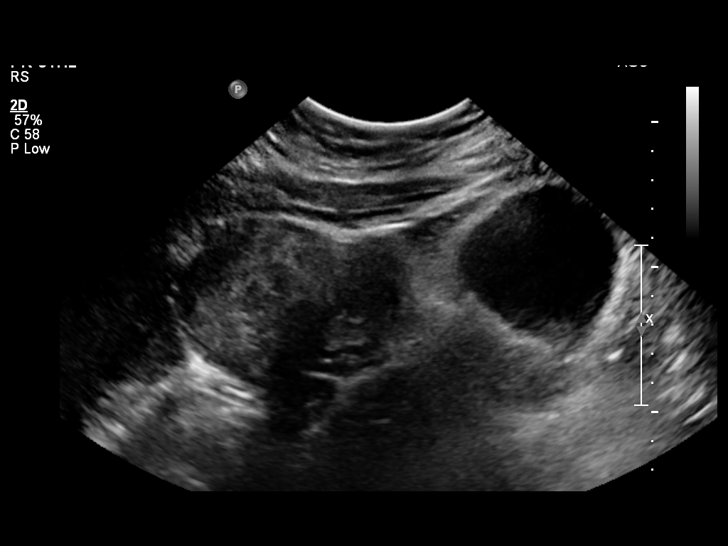
[im 8/40]
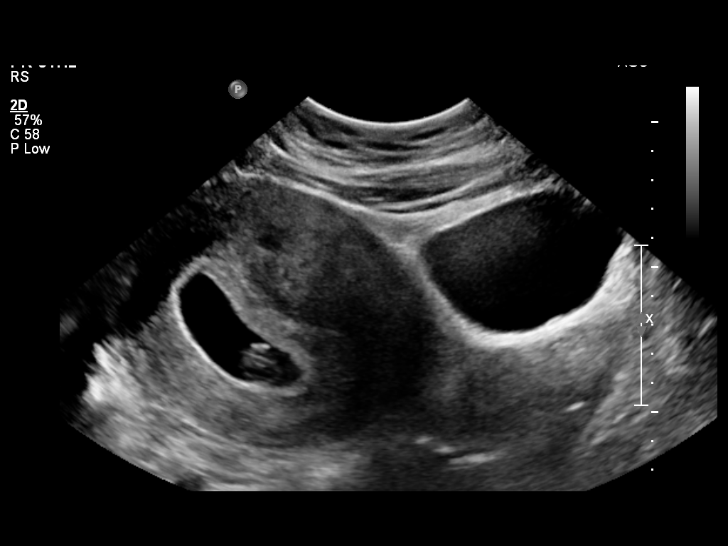
[im 11/40]
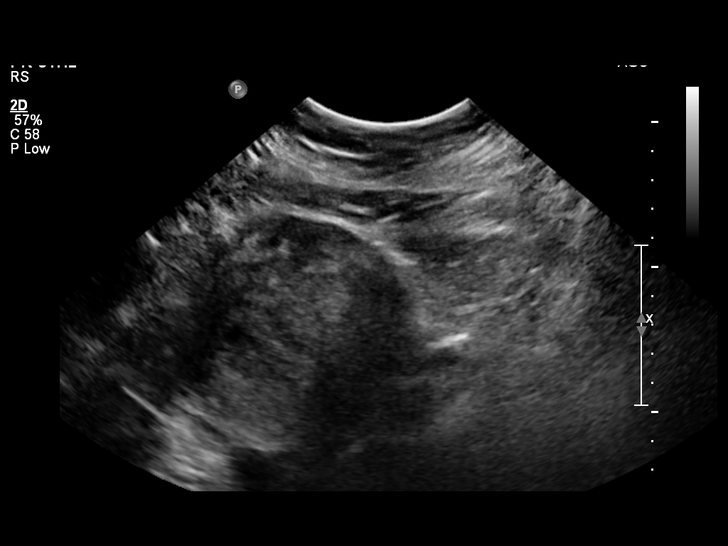
[im 14/40]
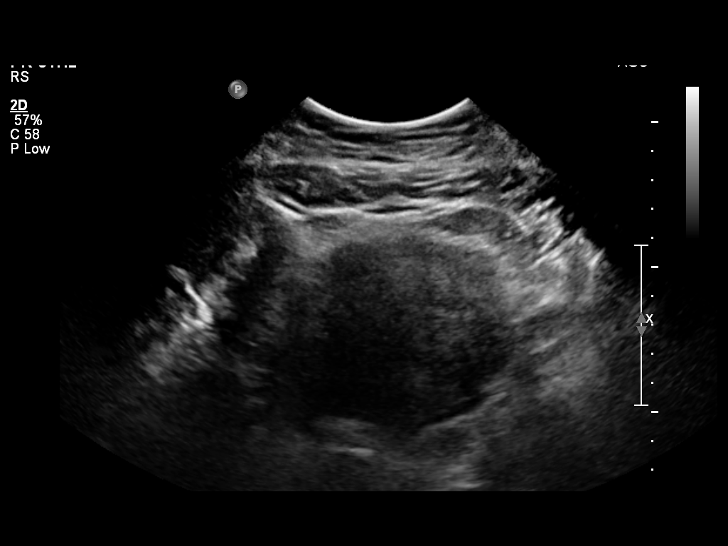
[im 16/40]
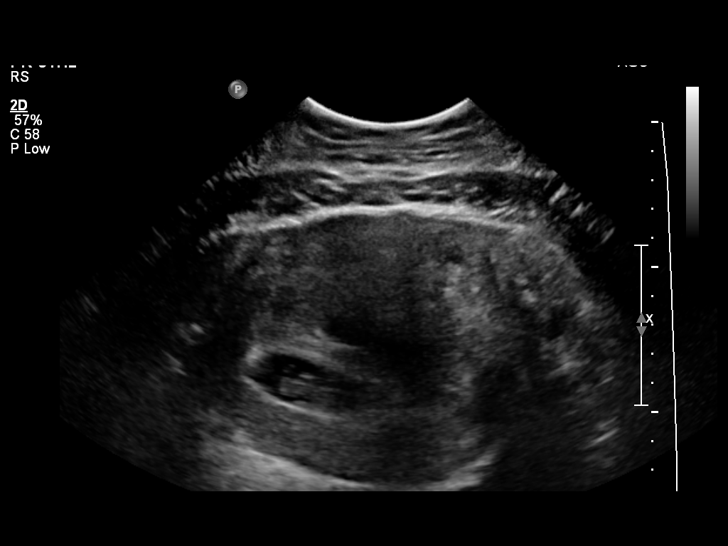
[im 21/40]
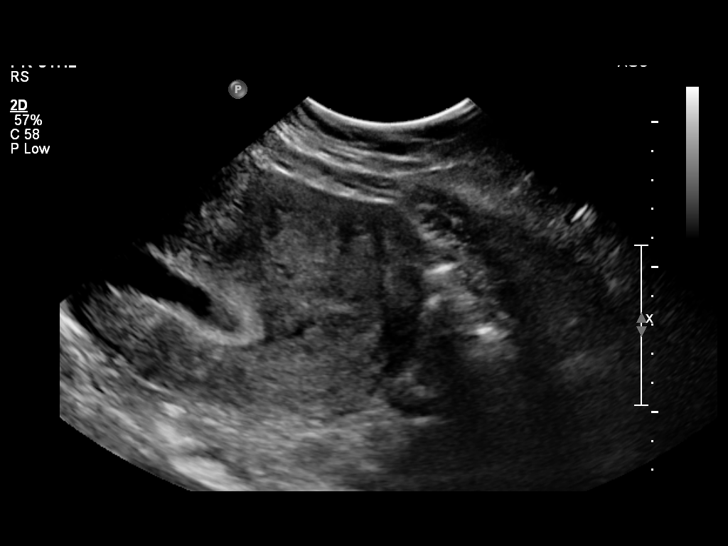
[im 24/40]
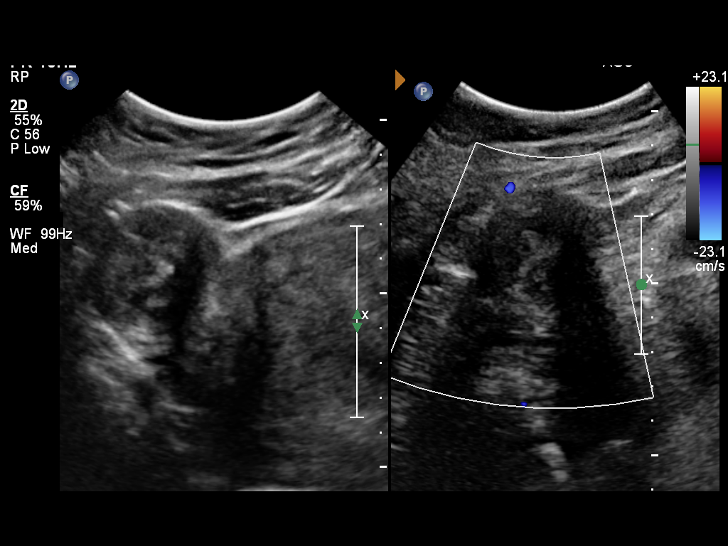
[im 27/40]
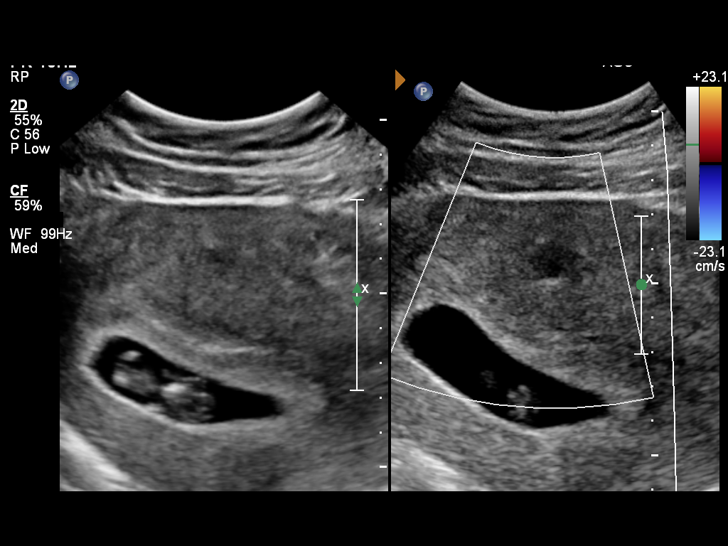
[im 29/40]
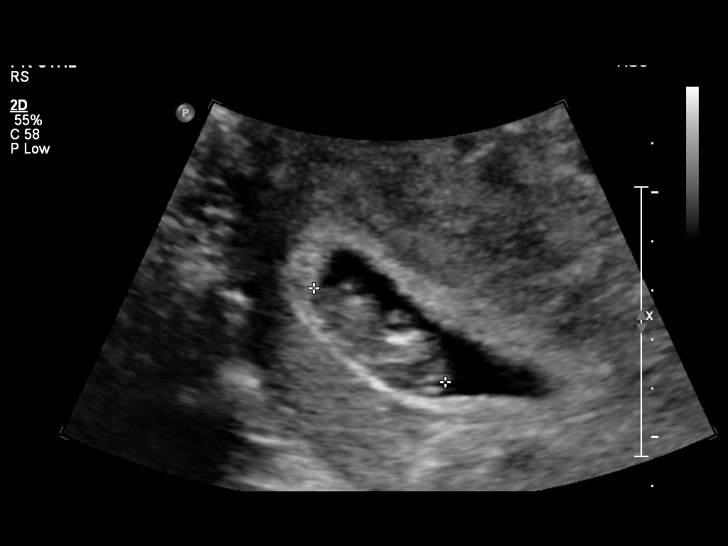
[im 32/40]
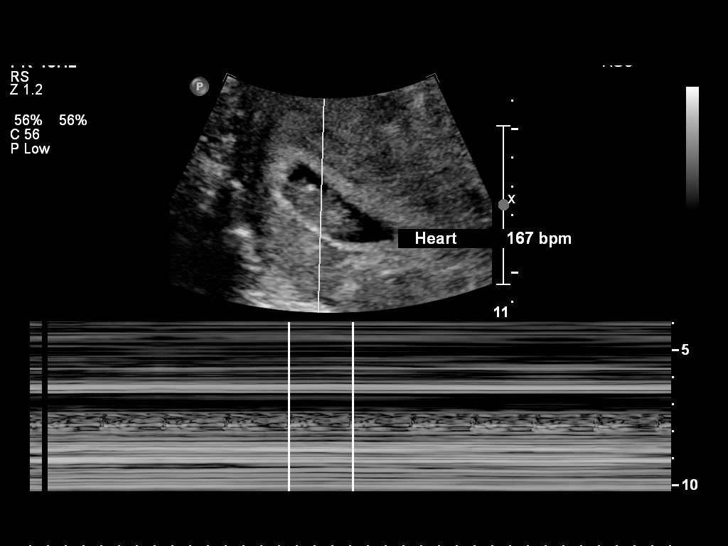
[im 35/40]
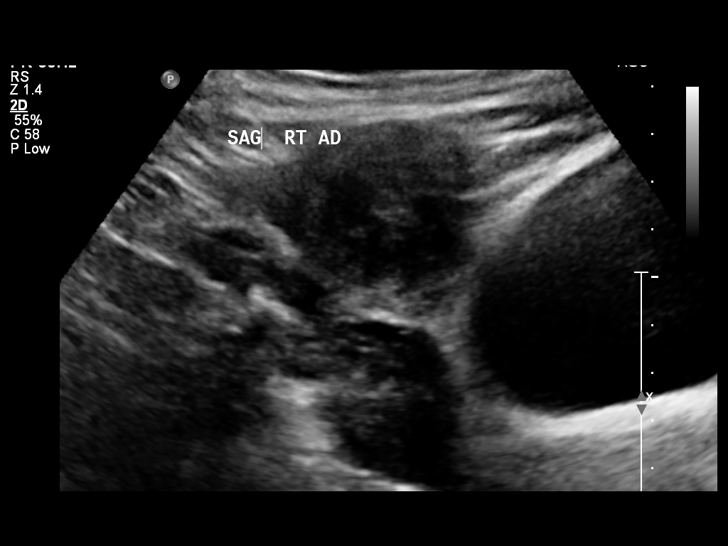
[im 38/40]
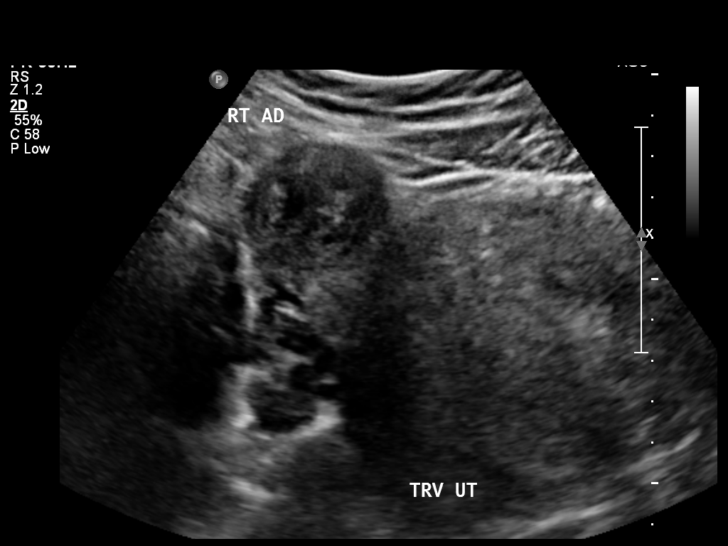

[13 of 28 positions shown; findings below may reference images not displayed]

FINDINGS: Intrauterine gestational sac: Visualized/normal in shape.

Yolk sac:  No

Embryo:  Yes

Cardiac Activity: Yes

Heart Rate: 167 bpm

CRL:   33  mm   10 w 2 d                  US EDC: 03/19/2015

Maternal uterus/adnexae: Uterus demonstrates a pedunculated fibroid
on the right measuring 3.8 x 3.7 x 4 cm. Mid uterine section mural
fibroid also noted measuring 4.1 x 2.5 x 3.3 cm. No other uterine
abnormality. No subchronic hemorrhage. Right ovary visualized,
unremarkable. No left ovary visualized. No adnexal masses. No pelvic
free fluid.
IMPRESSION: 1. Single live intrauterine pregnancy with a measured gestational
age of 10 weeks and 2 days, concordant with the expected gestational
age based on the last menstrual period.
2. There are 2 uterine fibroids, 1 pedunculated on the right and the
other mural anterior to the gestational sac. This latter fibroid
measures 4.1 cm in greatest dimension.
3. No other uterine abnormalities. No adnexal masses or pelvic free
fluid. Normal right ovary visualized. Left ovary not visualized.

## 2017-02-18 IMAGING — US US MFM OB LIMITED
1 series · 14 of 23 positions shown · non-contrast
Comparison: none

[Series 1: us mfm ob limited · 14 of 23 slices shown]
[im 1/23]
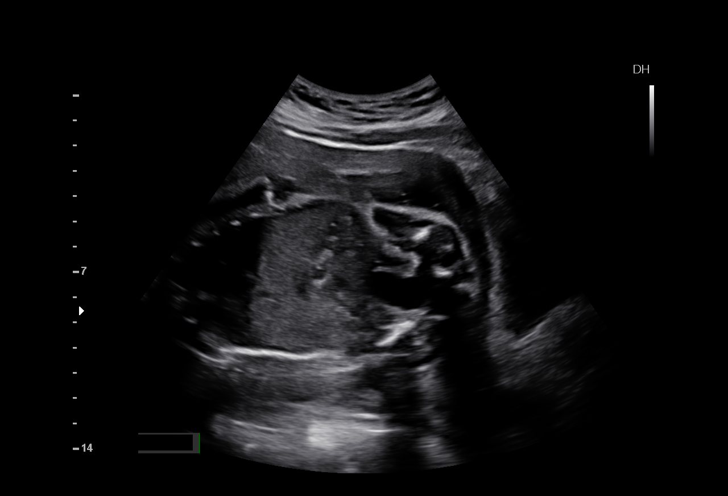
[im 3/23]
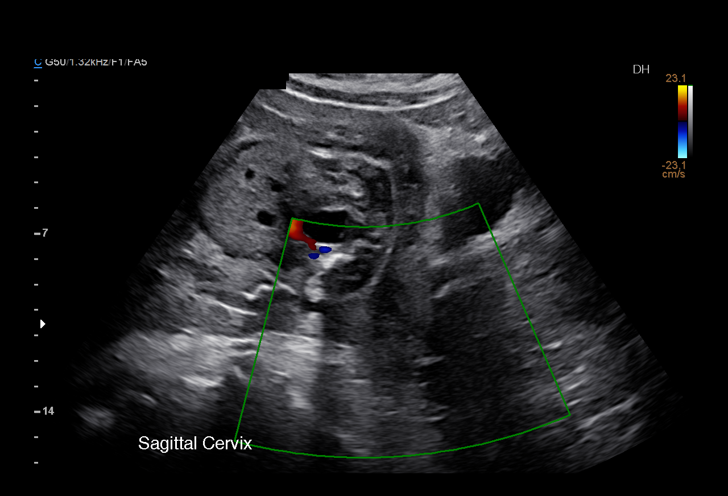
[im 5/23]
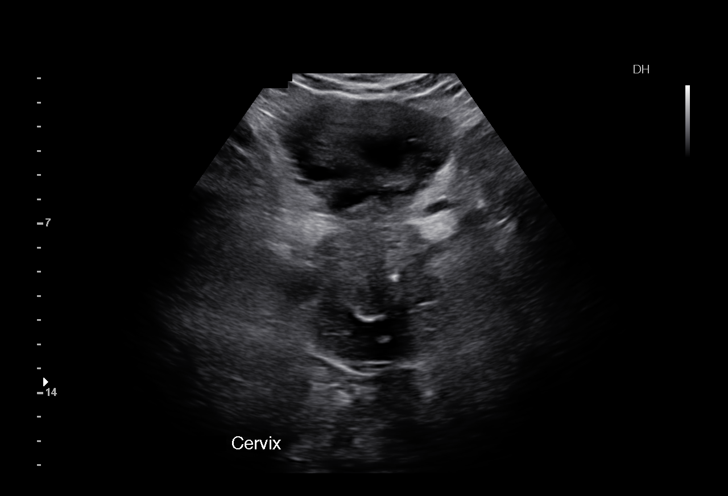
[im 6/23]
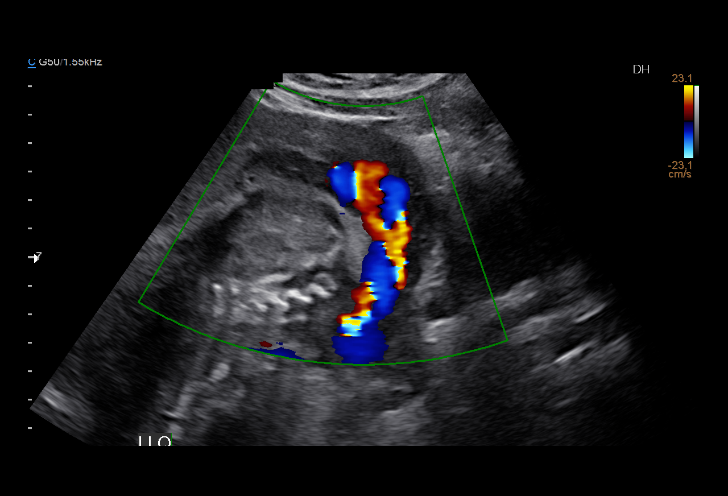
[im 8/23]
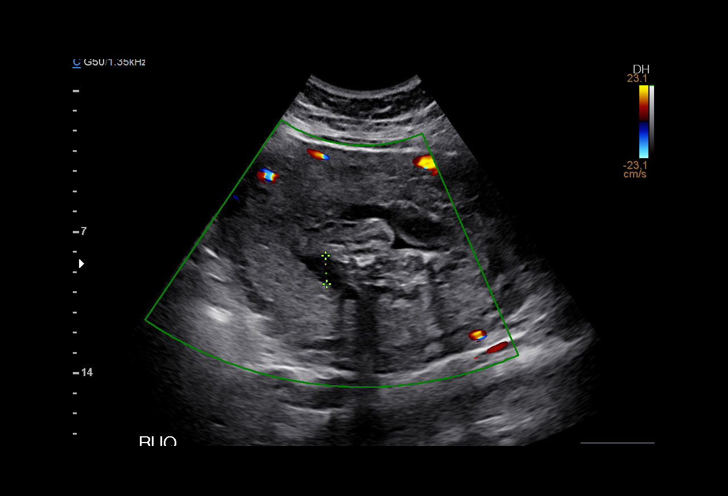
[im 10/23]
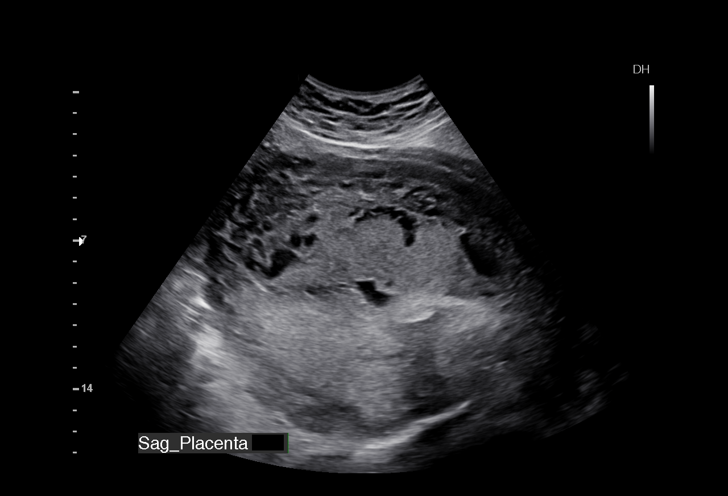
[im 11/23]
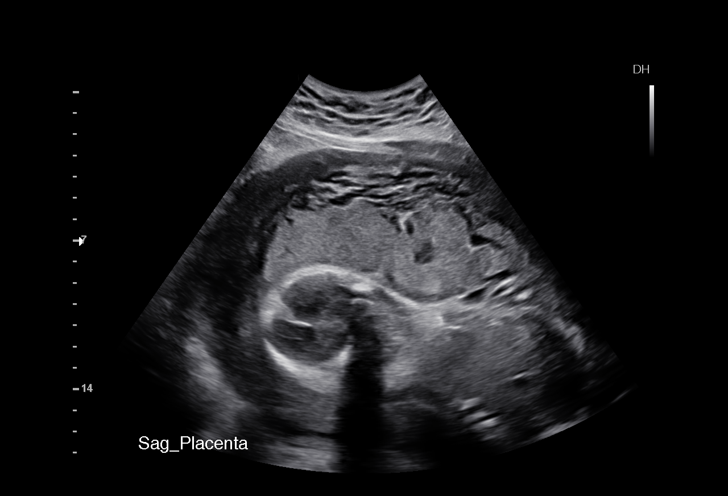
[im 13/23]
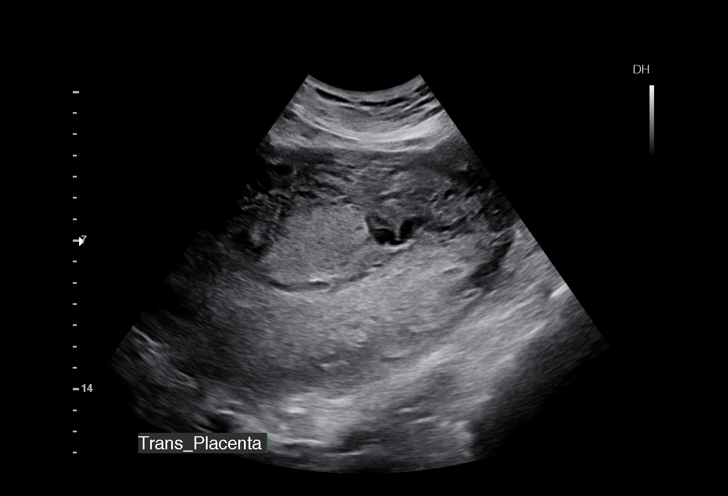
[im 14/23]
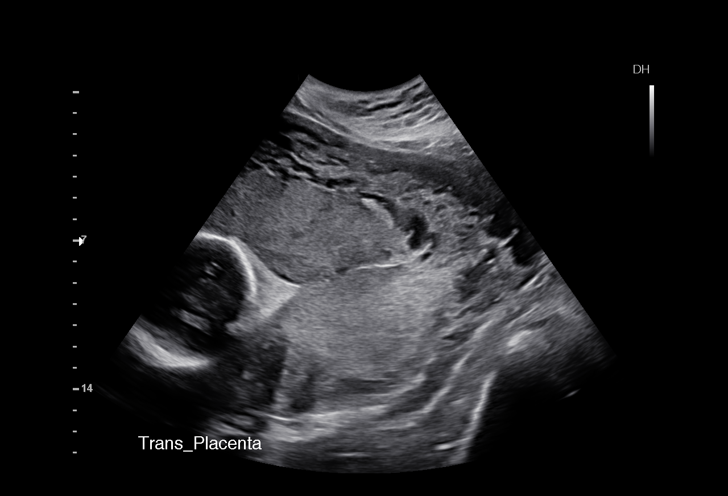
[im 16/23]
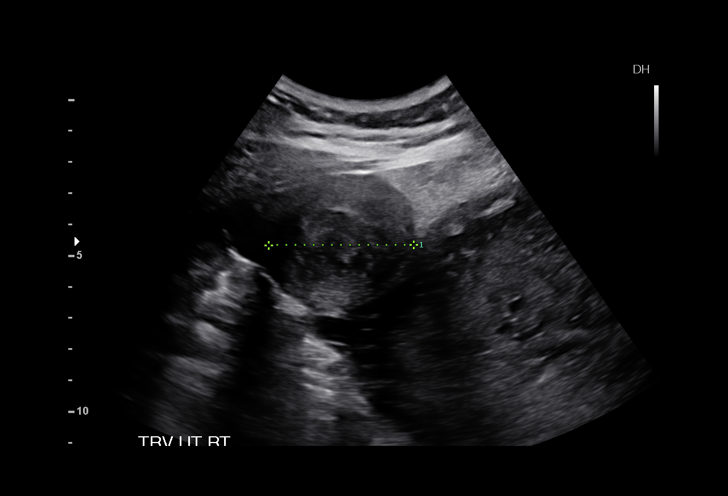
[im 18/23]
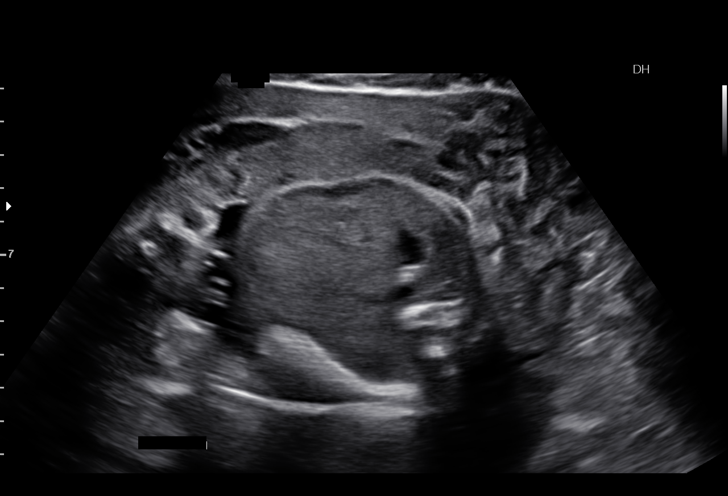
[im 19/23]
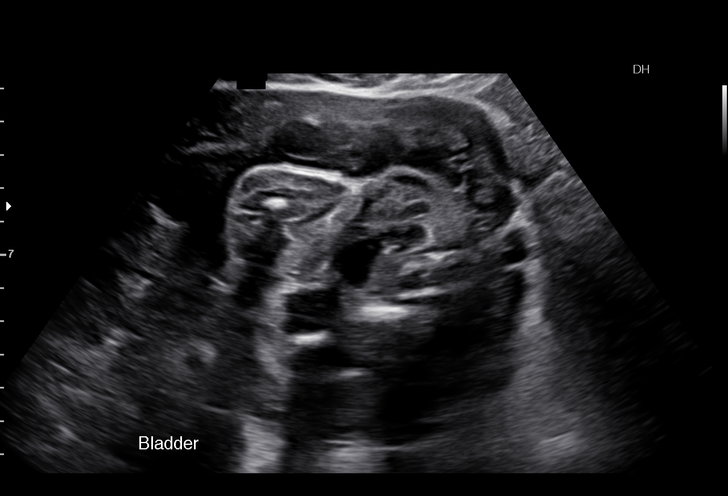
[im 21/23]
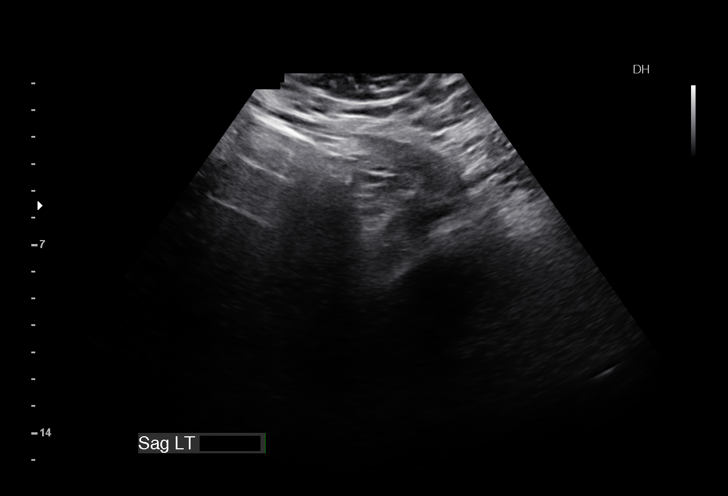
[im 23/23]
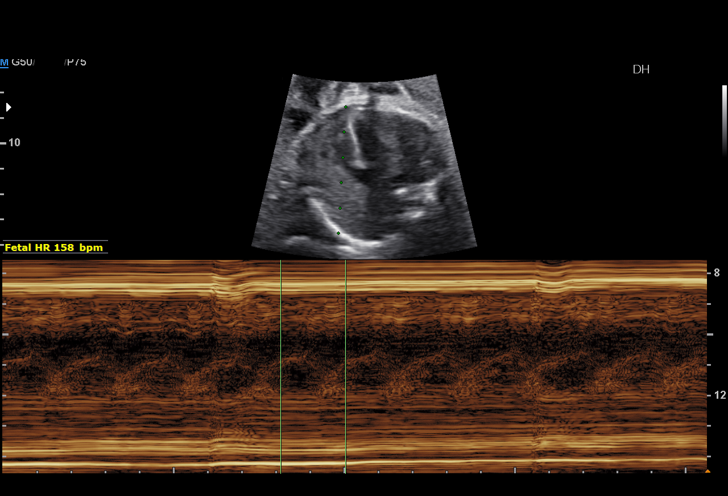

[14 of 23 positions shown; findings below may reference images not displayed]

Name:       ISALDINA ISIDORI                Visit Date: 12/09/2014 [DATE]
FAYTONG

Antenatal [REDACTED]9

1  ROSY IVETTE TODRIGUEZ           [id number]      [id number]     [id number]
Indications

25 weeks gestation of pregnancy
Premature rupture of membranes - leaking
fluid
Cervical cerclage suture present, (placed
09/17/14); second trimester
Poor obstetric history: Previous midtrimester
loss
Poor obstetric history: Previous preterm
delivery, antepartum
Advanced maternal age multigravida 34,
second trimester; low risk nips
Obesity complicating pregnancy
Uterine fibroids affecting pregnancy in        O34.12,
second trimester, antepartum
OB History

Gravidity:    6         Term:   3         SAB:   2
Living:       3
Fetal Evaluation

Num Of Fetuses:     1
Fetal Heart         158
Rate(bpm):
Cardiac Activity:   Observed
Presentation:       Frank breech
Placenta:           Anterior, above cervical os
P. Cord Insertion:  Previously Visualized
Amniotic Fluid
AFI FV:      Oligohydramnios
AFI Sum:     4.17    cm      < 3  %Tile     Larg Pckt:    1.75  cm
RUQ:   1.41    cm   RLQ:    1.75   cm    LUQ:   1.01    cm   LLQ:    0      cm
Gestational Age

LMP:           25w 2d        Date:  06/15/14                 EDD:   03/22/15
Best:          25w 2d     Det. By:  LMP  (06/15/14)          EDD:   03/22/15
Anatomy

Stomach:          Appears normal, left   Bladder:          Appears normal
sided
Cervix Uterus Adnexa

Cervix
Cerclage visualized.

Adnexa:       No abnormality visualized.
Myomas

Site                     L(cm)      W(cm)      D(cm)      Location
Right                    4.8        4.7        4.7        Subserosal
Posterior                4.7        4          2.9        Intramural

Blood Flow                 RI        PI       Comments

Previously seen
Impression

SIUP at 25+2 weeks
S/P SROM
Breech presentation
Oligohydramnios
Cervical insufficiency with cerclage in place
Fibroid uterus: see above for size and location
Recommendations

Recommend interval growth assessment and AFI in 1-2
weeks.
Inpatient monitoring as clinically indicated in the interim.

## 2017-05-22 ENCOUNTER — Other Ambulatory Visit (HOSPITAL_COMMUNITY): Payer: Self-pay | Admitting: *Deleted

## 2017-05-22 DIAGNOSIS — N6310 Unspecified lump in the right breast, unspecified quadrant: Secondary | ICD-10-CM

## 2017-05-28 ENCOUNTER — Encounter (HOSPITAL_COMMUNITY): Payer: Self-pay

## 2017-05-28 ENCOUNTER — Ambulatory Visit
Admission: RE | Admit: 2017-05-28 | Discharge: 2017-05-28 | Disposition: A | Discharge status: Home / Self Care | Payer: No Typology Code available for payment source | Source: Ambulatory Visit | Attending: Obstetrics and Gynecology | Admitting: Obstetrics and Gynecology

## 2017-05-28 ENCOUNTER — Other Ambulatory Visit (HOSPITAL_COMMUNITY): Payer: Self-pay | Admitting: Obstetrics and Gynecology

## 2017-05-28 ENCOUNTER — Ambulatory Visit (HOSPITAL_COMMUNITY)
Admission: RE | Admit: 2017-05-28 | Discharge: 2017-05-28 | Disposition: A | Discharge status: Home / Self Care | Payer: Medicaid Other | Source: Ambulatory Visit | Attending: Obstetrics and Gynecology | Admitting: Obstetrics and Gynecology

## 2017-05-28 VITALS — BP 124/82 | Ht 68.0 in | Wt 212.0 lb

## 2017-05-28 DIAGNOSIS — N644 Mastodynia: Secondary | ICD-10-CM

## 2017-05-28 DIAGNOSIS — N6310 Unspecified lump in the right breast, unspecified quadrant: Secondary | ICD-10-CM

## 2017-05-28 DIAGNOSIS — R2231 Localized swelling, mass and lump, right upper limb: Secondary | ICD-10-CM

## 2017-05-28 DIAGNOSIS — N6320 Unspecified lump in the left breast, unspecified quadrant: Secondary | ICD-10-CM

## 2017-05-28 DIAGNOSIS — N6311 Unspecified lump in the right breast, upper outer quadrant: Secondary | ICD-10-CM

## 2017-05-28 DIAGNOSIS — N6313 Unspecified lump in the right breast, lower outer quadrant: Secondary | ICD-10-CM

## 2017-05-28 DIAGNOSIS — Z01419 Encounter for gynecological examination (general) (routine) without abnormal findings: Secondary | ICD-10-CM

## 2017-05-28 NOTE — Patient Instructions (Signed)
Explained breast self awareness with [name]. Let patient know that if today's Pap smear is normal that her next Pap smear will be due in one year due to her history of an abnormal Pap smear. Referred patient to the [facility] for a diagnostic mammogram and possible bilateral breast ultrasounds. Appointment scheduled for Tuesday, May 28 2017 at 1420. Patient aware of appointment and will be there. Let patient know will follow up with her within the next couple weeks with results of Pap smear by letter or phone. [place] verbalized understanding.  Kerwin Augustus, Arvil Chaco, RN 4:45 PM

## 2017-05-28 NOTE — Progress Notes (Signed)
Complaints of right breast lump x 3 years that has increased in size and is painful. Patient stated the nipple has changed over the past two weeks. Patient states the pain comes and goes. Patient rates the pain at a 5 out of 10.  Patient complained of a right axillary lump x 1 year that causes discomfort within arm.  Pap Smear: Pap smear completed today. Last Pap smear was 09/07/2014 at [facility] and HSIL with positive HPV. Patient was recommended to have a colposcopy to follow-up but did not follow-up. Per patient her last Pap smear is the only abnormal Pap smear she has had. Last Pap smear result is in Epic.  Physical exam: Breasts Breasts symmetrical. No skin abnormalities bilateral breasts. No nipple retraction bilateral breasts. No nipple discharge bilateral breasts. No lymphadenopathy. No lumps palpated left breast. Palpated two lumps within the right breast at 8 o'clock 7 cm from the nipple and 10 o'clock 10 cm from the nipple. Palpated a right axillary lump at 11 o'clock 22 cm from the nipple. Complaints of left outer lower breast and right breast tenderness when palpated lumps on exam. Referred patient to the [facility] for a diagnostic mammogram and possible bilateral breast ultrasounds. Appointment scheduled for Tuesday, May 28 2017 at 1420.        Pelvic/Bimanual   Ext Genitalia No lesions, no swelling and no discharge observed on external genitalia.         Vagina Vagina pink and normal texture. No lesions or discharge observed in vagina.          Cervix Cervix is present. Cervix pink and of normal texture. No discharge observed.     Uterus Uterus is present and palpable. Uterus in normal position and normal size.        Adnexae Bilateral ovaries present and palpable. No tenderness on palpation.         Rectovaginal No rectal exam completed today since patient had no rectal complaints. No skin abnormalities observed on exam.    Smoking  History: Patient is a former smoker that quit 01/09/2012  Patient Navigation: Patient education provided. Access to services provided for patient through BCCCP program.   Breast and Cervical Cancer Risk Assessment: Patient has no family history of breast cancer, known genetic mutations, or radiation treatment to the chest before age 73. Patient has no history of cervical dysplasia, immunocompromised, or DES exposure in-utero. Patients 5-Year breast cancer risk is at 0.5% and Lifetime risk at 9.9%.

## 2017-05-30 LAB — CYTOLOGY - PAP
DIAGNOSIS: HIGH — AB
HPV: DETECTED — AB

## 2017-06-11 ENCOUNTER — Telehealth (HOSPITAL_COMMUNITY): Payer: Self-pay | Admitting: *Deleted

## 2017-06-11 NOTE — Telephone Encounter (Signed)
Patient returned call to [facility]. Advised patient of abnormal pap smear results. Advised patient would need colpo. Patient is scheduled at [facility] on July 2 8:15. Patient voiced understanding.

## 2017-06-17 ENCOUNTER — Encounter (HOSPITAL_COMMUNITY): Payer: Self-pay | Admitting: *Deleted

## 2017-07-09 ENCOUNTER — Ambulatory Visit (INDEPENDENT_AMBULATORY_CARE_PROVIDER_SITE_OTHER): Payer: Medicaid Other | Admitting: Obstetrics & Gynecology

## 2017-07-09 ENCOUNTER — Encounter: Payer: Self-pay | Admitting: Obstetrics & Gynecology

## 2017-07-09 ENCOUNTER — Other Ambulatory Visit (HOSPITAL_COMMUNITY)
Admission: RE | Admit: 2017-07-09 | Discharge: 2017-07-09 | Disposition: A | Discharge status: Home / Self Care | Payer: Medicaid Other | Source: Ambulatory Visit | Attending: Obstetrics & Gynecology | Admitting: Obstetrics & Gynecology

## 2017-07-09 VITALS — BP 135/71 | HR 90 | Ht 68.0 in | Wt 208.0 lb

## 2017-07-09 DIAGNOSIS — N871 Moderate cervical dysplasia: Secondary | ICD-10-CM | POA: Insufficient documentation

## 2017-07-09 DIAGNOSIS — Z3202 Encounter for pregnancy test, result negative: Secondary | ICD-10-CM

## 2017-07-09 DIAGNOSIS — R87613 High grade squamous intraepithelial lesion on cytologic smear of cervix (HGSIL): Secondary | ICD-10-CM | POA: Diagnosis present

## 2017-07-09 LAB — POCT PREGNANCY, URINE: Preg Test, Ur: NEGATIVE

## 2017-07-09 NOTE — Progress Notes (Signed)
   Subjective:    Patient ID: Tamara Green, female    DOB: 03/26/1980, 37 y.o.   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 She reports a h/o several cerclages.    Review of Systems She uses condoms at times for contraception, does not want another pregnancy but doesn't want reliable contraception.    Objective:   Physical Exam Breathing, conversing, and ambulating normally Well nourished, well hydrated Black female, no apparent distress UPT negative, consent signed, time out done Cervix prepped with acetic acid. Transformation zone seen in its entirety. Colpo adequate. 55mm x 30mm area of punctation at the 12 o'clock position entering into the endcervical os (large opening) I biopsied this area. Silver nitrate achieved hemostasis ECC obtained. She tolerated the procedure well.     Assessment & Plan:  HGSIL- await pathology Come back for results in a week

## 2017-07-12 ENCOUNTER — Encounter: Payer: Self-pay | Admitting: Obstetrics & Gynecology

## 2017-07-15 ENCOUNTER — Encounter: Payer: Self-pay | Admitting: *Deleted

## 2017-07-15 ENCOUNTER — Encounter: Payer: Self-pay | Admitting: Obstetrics & Gynecology

## 2017-07-15 NOTE — Progress Notes (Signed)
Opened in error

## 2017-07-16 ENCOUNTER — Encounter: Payer: Self-pay | Admitting: Obstetrics & Gynecology

## 2017-07-17 ENCOUNTER — Encounter (INDEPENDENT_AMBULATORY_CARE_PROVIDER_SITE_OTHER): Payer: Self-pay

## 2017-07-18 ENCOUNTER — Ambulatory Visit (INDEPENDENT_AMBULATORY_CARE_PROVIDER_SITE_OTHER): Payer: Self-pay | Admitting: Obstetrics & Gynecology

## 2017-07-18 ENCOUNTER — Encounter: Payer: Self-pay | Admitting: Obstetrics & Gynecology

## 2017-07-18 VITALS — Wt 208.4 lb

## 2017-07-18 DIAGNOSIS — D069 Carcinoma in situ of cervix, unspecified: Secondary | ICD-10-CM | POA: Insufficient documentation

## 2017-07-18 DIAGNOSIS — N871 Moderate cervical dysplasia: Secondary | ICD-10-CM

## 2017-07-18 NOTE — Progress Notes (Signed)
   Subjective:    Patient ID: Tamara Green, female    DOB: [birth year], 37 y.o.   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

## 2017-07-19 ENCOUNTER — Encounter: Payer: Self-pay | Admitting: *Deleted

## 2017-07-22 ENCOUNTER — Encounter (HOSPITAL_COMMUNITY): Payer: Self-pay

## 2017-07-24 ENCOUNTER — Telehealth (HOSPITAL_COMMUNITY): Payer: Self-pay | Admitting: *Deleted

## 2017-07-24 NOTE — Telephone Encounter (Signed)
Telephoned patient at home number and left message to return call to [facility]. Need to meet with patient to fill out BCCCP Medicaid paperwork.

## 2017-07-29 ENCOUNTER — Telehealth (HOSPITAL_COMMUNITY): Payer: Self-pay | Admitting: *Deleted

## 2017-07-29 NOTE — Telephone Encounter (Signed)
Patient to meet Tuesday July 23 to fill out [facility] paperwork.

## 2017-08-05 ENCOUNTER — Encounter: Payer: Self-pay | Admitting: General Practice

## 2017-08-05 ENCOUNTER — Ambulatory Visit (INDEPENDENT_AMBULATORY_CARE_PROVIDER_SITE_OTHER): Payer: Self-pay | Admitting: General Practice

## 2017-08-05 ENCOUNTER — Telehealth: Payer: Self-pay | Admitting: General Practice

## 2017-08-05 ENCOUNTER — Other Ambulatory Visit (HOSPITAL_COMMUNITY)
Admission: RE | Admit: 2017-08-05 | Discharge: 2017-08-05 | Disposition: A | Discharge status: Home / Self Care | Payer: Medicaid Other | Source: Ambulatory Visit | Attending: Student | Admitting: Student

## 2017-08-05 ENCOUNTER — Encounter: Payer: Self-pay | Admitting: Obstetrics & Gynecology

## 2017-08-05 DIAGNOSIS — N898 Other specified noninflammatory disorders of vagina: Secondary | ICD-10-CM

## 2017-08-05 DIAGNOSIS — B379 Candidiasis, unspecified: Secondary | ICD-10-CM

## 2017-08-05 NOTE — Progress Notes (Signed)
Patient presents to office today reporting white vaginal discharge with irritation & slight odor. Patient reports she is still breastfeeding her 37 yr old. Patient instructed in self swab & specimen collected. Discussed we will contact her with any abnormal results/medications sent to pharmacy. Patient verbalized understanding & had no questions.

## 2017-08-05 NOTE — Telephone Encounter (Signed)
Received notice from [facility] that patient needs to sign BTL papers prior to 8/2 or surgery will be cancelled. Called & discussed with patient. Patient verbalized understanding & states she will try to come tonight. Patient asked about mychart message regarding BV/yeast. Told patient if she comes in this evening we can look into that. Patient verbalized understanding and had no questions.

## 2017-08-06 ENCOUNTER — Encounter: Payer: Self-pay | Admitting: Obstetrics & Gynecology

## 2017-08-06 LAB — CERVICOVAGINAL ANCILLARY ONLY
Bacterial vaginitis: NEGATIVE
Candida vaginitis: POSITIVE — AB

## 2017-08-06 MED ORDER — NYSTATIN 1000000 UNITS PO CAPS: 1.0000 | ORAL_CAPSULE | Freq: Every day | ORAL | 0 refills | Status: AC

## 2017-08-06 MED ORDER — FLUCONAZOLE 150 MG PO TABS: 150.0000 mg | ORAL_TABLET | Freq: Every day | ORAL | 1 refills | Status: DC

## 2017-08-06 NOTE — Addendum Note (Signed)
Addended by: Jorje Guild B on: 08/06/2017 04:32 PM   Modules accepted: Orders

## 2017-08-06 NOTE — Progress Notes (Signed)
Chart reviewed for nurse visit. Agree with plan of care.   Jorje Guild, NP 08/06/2017 2:38 PM

## 2017-08-07 ENCOUNTER — Encounter: Payer: Self-pay | Admitting: *Deleted

## 2017-08-12 ENCOUNTER — Encounter (HOSPITAL_COMMUNITY): Payer: Self-pay | Admitting: *Deleted

## 2017-08-30 ENCOUNTER — Encounter: Payer: Self-pay | Admitting: Obstetrics & Gynecology

## 2017-08-30 ENCOUNTER — Ambulatory Visit: Payer: Medicaid Other | Admitting: Obstetrics & Gynecology

## 2017-08-30 ENCOUNTER — Telehealth (HOSPITAL_COMMUNITY): Payer: Self-pay

## 2017-08-30 DIAGNOSIS — N871 Moderate cervical dysplasia: Secondary | ICD-10-CM

## 2017-08-30 NOTE — Telephone Encounter (Signed)
Tried to reach I did leave a message to call [facility]CCCP

## 2017-08-30 NOTE — Progress Notes (Signed)
   Subjective:    Patient ID: Tamara Green, female    DOB: May 22, 1980, 37 y.o.   MRN: [id number]  HPI 37 yo married P4 here today for LEEP. However, we don't have the handpiece needed to do the LEEP here today. She tells me that she now has medicaid and she would prefer to have a CKC at the time of her BTL.    Review of Systems     Objective:   Physical Exam Breathing, conversing, and ambulating normally Well nourished, well hydrated Black female, no apparent distress     Assessment & Plan:  As above I will send [facility] a message to add a CKC to her BTL.

## 2017-09-02 ENCOUNTER — Encounter (HOSPITAL_BASED_OUTPATIENT_CLINIC_OR_DEPARTMENT_OTHER): Payer: Self-pay | Admitting: *Deleted

## 2017-09-02 ENCOUNTER — Other Ambulatory Visit: Payer: Self-pay

## 2017-09-02 DIAGNOSIS — R21 Rash and other nonspecific skin eruption: Secondary | ICD-10-CM

## 2017-09-02 HISTORY — DX: Rash and other nonspecific skin eruption: R21

## 2017-09-02 NOTE — Pre-Procedure Instructions (Signed)
To come for urine preg.

## 2017-09-03 ENCOUNTER — Encounter (HOSPITAL_BASED_OUTPATIENT_CLINIC_OR_DEPARTMENT_OTHER)
Admission: RE | Admit: 2017-09-03 | Discharge: 2017-09-03 | Disposition: A | Discharge status: Home / Self Care | Payer: Medicaid Other | Source: Ambulatory Visit | Attending: Obstetrics & Gynecology | Admitting: Obstetrics & Gynecology

## 2017-09-03 DIAGNOSIS — Z01812 Encounter for preprocedural laboratory examination: Secondary | ICD-10-CM | POA: Insufficient documentation

## 2017-09-03 LAB — POCT PREGNANCY, URINE: PREG TEST UR: NEGATIVE

## 2017-09-10 ENCOUNTER — Encounter (HOSPITAL_COMMUNITY): Payer: Self-pay

## 2017-09-10 ENCOUNTER — Other Ambulatory Visit: Payer: Self-pay

## 2017-09-10 ENCOUNTER — Ambulatory Visit (HOSPITAL_BASED_OUTPATIENT_CLINIC_OR_DEPARTMENT_OTHER): Payer: Medicaid Other | Admitting: Certified Registered"

## 2017-09-10 ENCOUNTER — Encounter (HOSPITAL_BASED_OUTPATIENT_CLINIC_OR_DEPARTMENT_OTHER): Payer: Self-pay | Admitting: *Deleted

## 2017-09-10 ENCOUNTER — Ambulatory Visit (HOSPITAL_BASED_OUTPATIENT_CLINIC_OR_DEPARTMENT_OTHER)
Admission: RE | Admit: 2017-09-10 | Discharge: 2017-09-10 | Disposition: A | Discharge status: Home / Self Care | Payer: Medicaid Other | Source: Ambulatory Visit | Attending: Obstetrics & Gynecology | Admitting: Obstetrics & Gynecology

## 2017-09-10 ENCOUNTER — Encounter (HOSPITAL_BASED_OUTPATIENT_CLINIC_OR_DEPARTMENT_OTHER)
Admission: RE | Disposition: A | Discharge status: Home / Self Care | Payer: Self-pay | Source: Ambulatory Visit | Attending: Obstetrics & Gynecology

## 2017-09-10 DIAGNOSIS — Z302 Encounter for sterilization: Secondary | ICD-10-CM | POA: Insufficient documentation

## 2017-09-10 DIAGNOSIS — Z6831 Body mass index (BMI) 31.0-31.9, adult: Secondary | ICD-10-CM | POA: Diagnosis not present

## 2017-09-10 DIAGNOSIS — D069 Carcinoma in situ of cervix, unspecified: Secondary | ICD-10-CM | POA: Insufficient documentation

## 2017-09-10 DIAGNOSIS — M069 Rheumatoid arthritis, unspecified: Secondary | ICD-10-CM | POA: Insufficient documentation

## 2017-09-10 DIAGNOSIS — D259 Leiomyoma of uterus, unspecified: Secondary | ICD-10-CM | POA: Diagnosis not present

## 2017-09-10 DIAGNOSIS — K660 Peritoneal adhesions (postprocedural) (postinfection): Secondary | ICD-10-CM | POA: Insufficient documentation

## 2017-09-10 DIAGNOSIS — K219 Gastro-esophageal reflux disease without esophagitis: Secondary | ICD-10-CM | POA: Diagnosis not present

## 2017-09-10 DIAGNOSIS — Z91040 Latex allergy status: Secondary | ICD-10-CM | POA: Insufficient documentation

## 2017-09-10 DIAGNOSIS — N871 Moderate cervical dysplasia: Secondary | ICD-10-CM | POA: Diagnosis not present

## 2017-09-10 DIAGNOSIS — N813 Complete uterovaginal prolapse: Secondary | ICD-10-CM | POA: Insufficient documentation

## 2017-09-10 DIAGNOSIS — Z87891 Personal history of nicotine dependence: Secondary | ICD-10-CM | POA: Insufficient documentation

## 2017-09-10 HISTORY — PX: LAPAROSCOPIC TUBAL LIGATION: SHX1937

## 2017-09-10 HISTORY — PX: CERVICAL CONIZATION W/BX: SHX1330

## 2017-09-10 HISTORY — DX: Heartburn: R12

## 2017-09-10 HISTORY — DX: Rash and other nonspecific skin eruption: R21

## 2017-09-10 HISTORY — DX: Hyperesthesia: R20.3

## 2017-09-10 SURGERY — LIGATION, FALLOPIAN TUBE, LAPAROSCOPIC
Anesthesia: General | Site: Vagina

## 2017-09-10 MED ORDER — PROPOFOL 10 MG/ML IV BOLUS
INTRAVENOUS | Status: AC
  Filled 2017-09-10: qty 40

## 2017-09-10 MED ORDER — ROCURONIUM BROMIDE 100 MG/10ML IV SOLN
INTRAVENOUS | Status: DC | PRN
  Administered 2017-09-10: 50 mg via INTRAVENOUS

## 2017-09-10 MED ORDER — MIDAZOLAM HCL 2 MG/2ML IJ SOLN
1.0000 mg | INTRAMUSCULAR | Status: DC | PRN
  Administered 2017-09-10: 2 mg via INTRAVENOUS

## 2017-09-10 MED ORDER — MEPERIDINE HCL 25 MG/ML IJ SOLN: 6.2500 mg | INTRAMUSCULAR | Status: DC | PRN

## 2017-09-10 MED ORDER — FENTANYL CITRATE (PF) 100 MCG/2ML IJ SOLN
25.0000 ug | INTRAMUSCULAR | Status: DC | PRN
  Administered 2017-09-10 (×3): 50 ug via INTRAVENOUS

## 2017-09-10 MED ORDER — DEXAMETHASONE SODIUM PHOSPHATE 10 MG/ML IJ SOLN
INTRAMUSCULAR | Status: AC
  Filled 2017-09-10: qty 1

## 2017-09-10 MED ORDER — ROCURONIUM BROMIDE 50 MG/5ML IV SOSY
PREFILLED_SYRINGE | INTRAVENOUS | Status: AC
  Filled 2017-09-10: qty 5

## 2017-09-10 MED ORDER — LIDOCAINE HCL (CARDIAC) PF 100 MG/5ML IV SOSY
PREFILLED_SYRINGE | INTRAVENOUS | Status: DC | PRN
  Administered 2017-09-10: 60 mg via INTRAVENOUS

## 2017-09-10 MED ORDER — LIDOCAINE 2% (20 MG/ML) 5 ML SYRINGE
INTRAMUSCULAR | Status: AC
  Filled 2017-09-10: qty 5

## 2017-09-10 MED ORDER — FENTANYL CITRATE (PF) 100 MCG/2ML IJ SOLN
INTRAMUSCULAR | Status: AC
  Filled 2017-09-10: qty 2

## 2017-09-10 MED ORDER — SODIUM CHLORIDE 0.9 % IJ SOLN
INTRAMUSCULAR | Status: AC
  Filled 2017-09-10: qty 10

## 2017-09-10 MED ORDER — HYDROCODONE-ACETAMINOPHEN 5-325 MG PO TABS
1.0000 | ORAL_TABLET | Freq: Once | ORAL | Status: AC
  Administered 2017-09-10: 1 via ORAL

## 2017-09-10 MED ORDER — FENTANYL CITRATE (PF) 100 MCG/2ML IJ SOLN
50.0000 ug | INTRAMUSCULAR | Status: DC | PRN
  Administered 2017-09-10: 100 ug via INTRAVENOUS

## 2017-09-10 MED ORDER — IBUPROFEN 800 MG PO TABS: 800.0000 mg | ORAL_TABLET | Freq: Three times a day (TID) | ORAL | 0 refills | Status: DC | PRN

## 2017-09-10 MED ORDER — IODINE STRONG (LUGOLS) 5 % PO SOLN
ORAL | Status: AC
  Filled 2017-09-10: qty 1

## 2017-09-10 MED ORDER — SCOPOLAMINE 1 MG/3DAYS TD PT72: 1.0000 | MEDICATED_PATCH | Freq: Once | TRANSDERMAL | Status: DC | PRN

## 2017-09-10 MED ORDER — HYDROCODONE-ACETAMINOPHEN 5-325 MG PO TABS
ORAL_TABLET | ORAL | Status: AC
  Filled 2017-09-10: qty 1

## 2017-09-10 MED ORDER — IODINE STRONG (LUGOLS) 5 % PO SOLN
ORAL | Status: DC | PRN
  Administered 2017-09-10: 0.1 mL

## 2017-09-10 MED ORDER — PROMETHAZINE HCL 25 MG/ML IJ SOLN
6.2500 mg | INTRAMUSCULAR | Status: DC | PRN
  Administered 2017-09-10: 6.25 mg via INTRAVENOUS

## 2017-09-10 MED ORDER — PROMETHAZINE HCL 25 MG/ML IJ SOLN: 6.2500 mg | INTRAMUSCULAR | Status: DC | PRN

## 2017-09-10 MED ORDER — MIDAZOLAM HCL 2 MG/2ML IJ SOLN
INTRAMUSCULAR | Status: AC
  Filled 2017-09-10: qty 2

## 2017-09-10 MED ORDER — DEXAMETHASONE SODIUM PHOSPHATE 4 MG/ML IJ SOLN
INTRAMUSCULAR | Status: DC | PRN
  Administered 2017-09-10: 10 mg via INTRAVENOUS

## 2017-09-10 MED ORDER — ONDANSETRON HCL 4 MG/2ML IJ SOLN
INTRAMUSCULAR | Status: AC
  Filled 2017-09-10: qty 2

## 2017-09-10 MED ORDER — SUGAMMADEX SODIUM 500 MG/5ML IV SOLN
INTRAVENOUS | Status: DC | PRN
  Administered 2017-09-10: 400 mg via INTRAVENOUS

## 2017-09-10 MED ORDER — HYDROCODONE-ACETAMINOPHEN 7.5-325 MG PO TABS: 1.0000 | ORAL_TABLET | Freq: Once | ORAL | Status: DC | PRN

## 2017-09-10 MED ORDER — ONDANSETRON HCL 4 MG/2ML IJ SOLN
INTRAMUSCULAR | Status: DC | PRN
  Administered 2017-09-10: 4 mg via INTRAVENOUS

## 2017-09-10 MED ORDER — BUPIVACAINE HCL (PF) 0.5 % IJ SOLN
INTRAMUSCULAR | Status: DC | PRN
  Administered 2017-09-10: 10 mL
  Administered 2017-09-10: 20 mL

## 2017-09-10 MED ORDER — KETOROLAC TROMETHAMINE 30 MG/ML IJ SOLN: 30.0000 mg | Freq: Once | INTRAMUSCULAR | Status: DC | PRN

## 2017-09-10 MED ORDER — LACTATED RINGERS IV SOLN
INTRAVENOUS | Status: DC
  Administered 2017-09-10 (×2): via INTRAVENOUS

## 2017-09-10 MED ORDER — KETOROLAC TROMETHAMINE 30 MG/ML IJ SOLN
INTRAMUSCULAR | Status: DC | PRN
  Administered 2017-09-10: 30 mg via INTRAVENOUS

## 2017-09-10 MED ORDER — PROMETHAZINE HCL 25 MG/ML IJ SOLN
INTRAMUSCULAR | Status: AC
  Filled 2017-09-10: qty 1

## 2017-09-10 MED ORDER — PROPOFOL 10 MG/ML IV BOLUS
INTRAVENOUS | Status: DC | PRN
  Administered 2017-09-10: 150 mg via INTRAVENOUS

## 2017-09-10 MED ORDER — BUPIVACAINE HCL (PF) 0.5 % IJ SOLN
INTRAMUSCULAR | Status: AC
  Filled 2017-09-10: qty 90

## 2017-09-10 MED ORDER — FERRIC SUBSULFATE 259 MG/GM EX SOLN
CUTANEOUS | Status: AC
  Filled 2017-09-10: qty 8

## 2017-09-10 SURGICAL SUPPLY — 55 items
BANDAGE ADH SHEER 1  50/CT (GAUZE/BANDAGES/DRESSINGS) IMPLANT
BLADE SURG 11 STRL SS (BLADE) ×4 IMPLANT
BRIEF STRETCH FOR OB PAD XXL (UNDERPADS AND DIAPERS) ×4 IMPLANT
CLEANER CAUTERY TIP 5X5 PAD (MISCELLANEOUS) ×2 IMPLANT
CLIP FILSHIE TUBAL LIGA STRL (Clip) ×4 IMPLANT
CLOTH BEACON ORANGE TIMEOUT ST (SAFETY) IMPLANT
CONTAINER PREFILL 10% NBF 60ML (FORM) ×8 IMPLANT
COUNTER NEEDLE 1200 MAGNETIC (NEEDLE) ×4 IMPLANT
DILATOR CANAL MILEX (MISCELLANEOUS) IMPLANT
DRSG OPSITE POSTOP 3X4 (GAUZE/BANDAGES/DRESSINGS) ×4 IMPLANT
DURAPREP 26ML APPLICATOR (WOUND CARE) ×4 IMPLANT
ELECT BALL LEEP 5MM RED (ELECTRODE) IMPLANT
ELECT REM PT RETURN 9FT ADLT (ELECTROSURGICAL) ×4
ELECTRODE REM PT RTRN 9FT ADLT (ELECTROSURGICAL) ×2 IMPLANT
GLOVE BIO SURGEON STRL SZ 6.5 (GLOVE) ×3 IMPLANT
GLOVE BIO SURGEON STRL SZ7 (GLOVE) ×4 IMPLANT
GLOVE BIO SURGEONS STRL SZ 6.5 (GLOVE) ×1
GLOVE BIOGEL PI IND STRL 6 (GLOVE) ×2 IMPLANT
GLOVE BIOGEL PI IND STRL 7.0 (GLOVE) ×4 IMPLANT
GLOVE BIOGEL PI INDICATOR 6 (GLOVE) ×2
GLOVE BIOGEL PI INDICATOR 7.0 (GLOVE) ×4
GOWN STRL REUS W/ TWL XL LVL3 (GOWN DISPOSABLE) ×2 IMPLANT
GOWN STRL REUS W/TWL LRG LVL3 (GOWN DISPOSABLE) ×8 IMPLANT
GOWN STRL REUS W/TWL XL LVL3 (GOWN DISPOSABLE) ×4
NEEDLE INSUFFLATION 120MM (ENDOMECHANICALS) ×4 IMPLANT
NEEDLE SPNL 18GX3.5 QUINCKE PK (NEEDLE) ×4 IMPLANT
NS IRRIG 1000ML POUR BTL (IV SOLUTION) ×4 IMPLANT
PACK LAPAROSCOPY BASIN (CUSTOM PROCEDURE TRAY) ×4 IMPLANT
PACK TRENDGUARD 450 HYBRID PRO (MISCELLANEOUS) ×2 IMPLANT
PACK TRENDGUARD 600 HYBRD PROC (MISCELLANEOUS) IMPLANT
PACK VAGINAL MINOR WOMEN LF (CUSTOM PROCEDURE TRAY) ×4 IMPLANT
PAD CLEANER CAUTERY TIP 5X5 (MISCELLANEOUS) ×2
PAD OB MATERNITY 4.3X12.25 (PERSONAL CARE ITEMS) ×4 IMPLANT
PAD PREP 24X48 CUFFED NSTRL (MISCELLANEOUS) ×4 IMPLANT
PENCIL BUTTON HOLSTER BLD 10FT (ELECTRODE) ×4 IMPLANT
SCOPETTES 8  STERILE (MISCELLANEOUS) ×4
SCOPETTES 8 STERILE (MISCELLANEOUS) ×4 IMPLANT
SHEARS HARMONIC ACE PLUS 36CM (ENDOMECHANICALS) IMPLANT
SLEEVE SCD COMPRESS KNEE MED (MISCELLANEOUS) ×4 IMPLANT
SLEEVE XCEL OPT CAN 5 100 (ENDOMECHANICALS) IMPLANT
SPONGE SURGIFOAM ABS GEL 12-7 (HEMOSTASIS) IMPLANT
SUT VIC AB 0 CT1 27 (SUTURE) ×6
SUT VIC AB 0 CT1 27XBRD ANBCTR (SUTURE) ×4 IMPLANT
SUT VICRYL 0 UR6 27IN ABS (SUTURE) ×4 IMPLANT
SUT VICRYL 4-0 PS2 18IN ABS (SUTURE) ×4 IMPLANT
SYSTEM CARTER THOMASON II (TROCAR) IMPLANT
TOWEL GREEN STERILE FF (TOWEL DISPOSABLE) ×8 IMPLANT
TRENDGUARD 450 HYBRID PRO PACK (MISCELLANEOUS) ×4
TRENDGUARD 600 HYBRID PROC PK (MISCELLANEOUS)
TROCAR OPTI TIP 5M 100M (ENDOMECHANICALS) IMPLANT
TROCAR XCEL DIL TIP R 11M (ENDOMECHANICALS) ×4 IMPLANT
TUBING INSUFFLATION (TUBING) ×4 IMPLANT
TUBING NON-CON 1/4 X 20 CONN (TUBING) ×3 IMPLANT
TUBING NON-CON 1/4 X 20' CONN (TUBING) ×1
YANKAUER SUCT BULB TIP NO VENT (SUCTIONS) ×4 IMPLANT

## 2017-09-10 NOTE — Anesthesia Preprocedure Evaluation (Signed)
Anesthesia Evaluation  Patient identified by MRN, date of birth, ID band Patient awake    Reviewed: Allergy & Precautions, NPO status , Patient's Chart, lab work & pertinent test results  Airway Mallampati: III  TM Distance: >3 FB Neck ROM: Full    Dental no notable dental hx. (+) Teeth Intact   Pulmonary former smoker,    Pulmonary exam normal breath sounds clear to auscultation       Cardiovascular negative cardio ROS Normal cardiovascular exam Rhythm:Regular Rate:Normal     Neuro/Psych negative neurological ROS  negative psych ROS   GI/Hepatic Neg liver ROS, GERD  Medicated and Controlled,  Endo/Other  Morbid obesity  Renal/GU negative Renal ROS     Musculoskeletal  (+) Arthritis , Rheumatoid disorders,    Abdominal (+) + obese,   Peds  Hematology  (+) anemia ,   Anesthesia Other Findings   Reproductive/Obstetrics (+) Pregnancy AMA 25 weeks SROM Transverse lie Incompetent cervix                             Anesthesia Physical  Anesthesia Plan  ASA: III  Anesthesia Plan: General   Post-op Pain Management:    Induction: Intravenous  PONV Risk Score and Plan: 4 or greater and Dexamethasone, Ondansetron, Midazolam and Scopolamine patch - Pre-op  Airway Management Planned: Oral ETT  Additional Equipment:   Intra-op Plan:   Post-operative Plan: Extubation in OR  Informed Consent: I have reviewed the patients History and Physical, chart, labs and discussed the procedure including the risks, benefits and alternatives for the proposed anesthesia with the patient or authorized representative who has indicated his/her understanding and acceptance.   Dental advisory given  Plan Discussed with: CRNA  Anesthesia Plan Comments:        Anesthesia Quick Evaluation

## 2017-09-10 NOTE — Anesthesia Postprocedure Evaluation (Signed)
Anesthesia Post Note  Patient: Tamara Green  Procedure(s) Performed: LAPAROSCOPIC TUBAL LIGATION - FILSHIE CLIPS (Bilateral Abdomen) COLD KNIFE CONIZATION (N/A Vagina )     Patient location during evaluation: PACU Anesthesia Type: General Level of consciousness: awake and alert Pain management: pain level controlled Vital Signs Assessment: post-procedure vital signs reviewed and stable Respiratory status: spontaneous breathing, nonlabored ventilation and respiratory function stable Cardiovascular status: blood pressure returned to baseline and stable Postop Assessment: no apparent nausea or vomiting Anesthetic complications: no    Last Vitals:  Vitals:   09/10/17 1200 09/10/17 1215  BP: 127/77 133/78  Pulse: 74 77  Resp: 12 16  Temp:    SpO2: 99% 97%    Last Pain:  Vitals:   09/10/17 1207  TempSrc:   PainSc: Asleep                 Lynda Rainwater

## 2017-09-10 NOTE — Discharge Instructions (Signed)
Laparoscopic Tubal Ligation, Care After Refer to this sheet in the next few weeks. These instructions provide you with information about caring for yourself after your procedure. Your health care provider may also give you more specific instructions. Your treatment has been planned according to current medical practices, but problems sometimes occur. Call your health care provider if you have any problems or questions after your procedure. What can I expect after the procedure? After the procedure, it is common to have:  A sore throat.  Discomfort in your shoulder.  Mild discomfort or cramping in your abdomen.  Gas pains.  Pain or soreness in the area where the surgical cut (incision) was made.  A bloated feeling.  Tiredness.  Nausea.  Vomiting.  Follow these instructions at home: Medicines  Take over-the-counter and prescription medicines only as told by your health care provider.  Do not take aspirin because it can cause bleeding.  Do not drive or operate heavy machinery while taking prescription pain medicine. Activity  Rest for the rest of the day.  Return to your normal activities as told by your health care provider. Ask your health care provider what activities are safe for you. Incision care   Follow instructions from your health care provider about how to take care of your incision. Make sure you: ? Wash your hands with soap and water before you change your bandage (dressing). If soap and water are not available, use hand sanitizer. ? Change your dressing as told by your health care provider. ? Leave stitches (sutures) in place. They may need to stay in place for 2 weeks or longer.  Check your incision area every day for signs of infection. Check for: ? More redness, swelling, or pain. ? More fluid or blood. ? Warmth. ? Pus or a bad smell. Other Instructions  Do not take baths, swim, or use a hot tub until your health care provider approves. You may take  showers.  Keep all follow-up visits as told by your health care provider. This is important.  Have someone help you with your daily household tasks for the first few days. Contact a health care provider if:  You have more redness, swelling, or pain around your incision.  Your incision feels warm to the touch.  You have pus or a bad smell coming from your incision.  The edges of your incision break open after the sutures have been removed.  Your pain does not improve after 2-3 days.  You have a rash.  You repeatedly become dizzy or light-headed.  Your pain medicine is not helping.  You are constipated. Get help right away if:  You have a fever.  You faint.  You have increasing pain in your abdomen.  You have severe pain in one or both of your shoulders.  You have fluid or blood coming from your sutures or from your vagina.  You have shortness of breath or difficulty breathing.  You have chest pain or leg pain.  You have ongoing nausea, vomiting, or diarrhea. This information is not intended to replace advice given to you by your health care provider. Make sure you discuss any questions you have with your health care provider. Document Released: 07/14/2004 Document Revised: 05/30/2015 Document Reviewed: 12/05/2014 Elsevier Interactive Patient Education  2018 [employer]. Cervical Conization, Care After This sheet gives you information about how to care for yourself after your procedure. Your doctor may also give you more specific instructions. If you have problems or questions, contact your  doctor. Follow these instructions at home: Medicines  Take over-the-counter and prescription medicines only as told by your doctor.  Do not take aspirin until your doctor says it is okay.  If you take pain medicine: ? You may have constipation. To help treat this, your doctor may tell you to:  Drink enough fluid to keep your pee (urine) clear or pale yellow.  Take  medicines.  Eat foods that are high in fiber. These include fresh fruits and vegetables, whole grains, bran, and beans.  Limit foods that are high in fat and sugar. These include fried foods and sweet foods. ? Do not drive or use heavy machines. General instructions  You can eat your usual diet unless your doctor tells you not to do so.  Take showers for the first week. Do not take baths, swim, or use hot tubs until your doctor says it is okay.  Do not douche, use tampons, or have sex until your doctor says it is okay.  For 7-14 days after your procedure, avoid: ? Being very active. ? Exercising. ? Heavy lifting.  Keep all follow-up visits as told by your doctor. This is important. Contact a doctor if:  You have a rash.  You are dizzy or lightheaded.  You feel sick to your stomach (nauseous).  You throw up (vomit).  You have fluid from your vagina (vaginal discharge) that smells bad. Get help right away if:  There are blood clots coming from your vagina.  You have more bleeding than you would have in a normal period. For example, you soak a pad in less than 1 hour.  You have a fever.  You have more and more cramps.  You pass out (faint).  You have pain when peeing.  Your have a lot of pain.  Your pain gets worse.  Your pain does not get better when you take your medicine.  You have blood in your pee.  You throw up (vomit). Summary  After your procedure, take over-the-counter and prescription medicines only as told by your doctor.  Do not douche, use tampons, or have sex until your doctor says it is okay.  For about 7-14 days after your procedure, try not to exercise or lift heavy objects.  Get help right away if you have new symptoms, or if your symptoms become worse. This information is not intended to replace advice given to you by your health care provider. Make sure you discuss any questions you have with your health care provider. Document  Released: 10/04/2007 Document Revised: 12/28/2015 Document Reviewed: 12/28/2015    Post Anesthesia Home Care Instructions  Activity: Get plenty of rest for the remainder of the day. A responsible individual must stay with you for 24 hours following the procedure.  For the next 24 hours, DO NOT: -Drive a car -Paediatric nurse -Drink alcoholic beverages -Take any medication unless instructed by your physician -Make any legal decisions or sign important papers.  Meals: Start with liquid foods such as gelatin or soup. Progress to regular foods as tolerated. Avoid greasy, spicy, heavy foods. If nausea and/or vomiting occur, drink only clear liquids until the nausea and/or vomiting subsides. Call your physician if vomiting continues.  Special Instructions/Symptoms: Your throat may feel dry or sore from the anesthesia or the breathing tube placed in your throat during surgery. If this causes discomfort, gargle with warm salt water. The discomfort should disappear within 24 hours.  If you had a scopolamine patch placed behind your ear for the  management of post- operative nausea and/or vomiting:  1. The medication in the patch is effective for 72 hours, after which it should be removed.  Wrap patch in a tissue and discard in the trash. Wash hands thoroughly with soap and water. 2. You may remove the patch earlier than 72 hours if you experience unpleasant side effects which may include dry mouth, dizziness or visual disturbances. 3. Avoid touching the patch. Wash your hands with soap and water after contact with the patch.    Chartered certified accountant Patient Education  [employer].

## 2017-09-10 NOTE — H&P (Signed)
Tamara Green is an 37 y.o. 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 She is currently having her period.    Patient's last menstrual period was 09/07/2017.    Past Medical History:  Diagnosis Date  . Anemia    no current med.  Marland Kitchen Heartburn    no current med.  Marland Kitchen History of blood transfusion 01/2014  . Rash of vulva 09/02/2017  . Rheumatoid arthritis(714.0)    no current med.  . Sensitive skin     Past Surgical History:  Procedure Laterality Date  . BREAST BIOPSY Right   . CERVICAL CERCLAGE  07/10/2000  . CERVICAL CERCLAGE N/A 09/17/2014   Procedure: CERCLAGE CERVICAL;  Surgeon: Shelly Bombard, MD;  Location: [facility];  Service: Gynecology;  Laterality: N/A;  . CERVICAL CERCLAGE  10/30/2001  . CERVICAL CERCLAGE  05/02/2006  . CESAREAN SECTION N/A 12/11/2014   Procedure: CESAREAN SECTION;  Surgeon: Frederico Hamman, MD;  Location: [facility];  Service: Obstetrics;  Laterality: N/A;    Family History  Problem Relation Age of Onset  . Diabetes Mother     Social History:  reports that she quit smoking about 5 years ago. She has never used smokeless tobacco. She reports that she drinks alcohol. She reports that she does not use drugs.  Allergies:  Allergies  Allergen Reactions  . Percocet [Oxycodone-Acetaminophen] Hives  . Latex Rash    Medications Prior to Admission  Medication Sig Dispense Refill Last Dose  . Acetaminophen (TYLENOL ARTHRITIS PAIN PO) Take by mouth.   More than a month at Unknown time    ROS  She developed a rash with a latex condom last week. She works at [employer] in [place].  Blood pressure 111/66, pulse 87, temperature 98.6 F (37 C), temperature source Oral, resp. rate 18, height 5\' 8"  (1.727 m), weight 94 kg, last menstrual period 09/07/2017, SpO2 100 %, not currently breastfeeding. Physical Exam Breathing, conversing, and  ambulating normally Well nourished, well hydrated Black female, no apparent distress Heart- rrr Lungs- CTAB Abd- benign  No results found for this or any previous visit (from the past 24 hour(s)).  No results found.  Assessment/Plan: Unwanted fertility- plan for laparoscopy with application of Filsche clips She understands the failure rate of 1 in 400. 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 She wishes to proceed.     Emily Filbert 09/10/2017, 9:54 AM

## 2017-09-10 NOTE — Transfer of Care (Signed)
Immediate Anesthesia Transfer of Care Note  Patient: Tamara Green  Procedure(s) Performed: LAPAROSCOPIC TUBAL LIGATION - FILSHIE CLIPS (Bilateral Abdomen) COLD KNIFE CONIZATION (N/A Vagina )  Patient Location: PACU  Anesthesia Type:General  Level of Consciousness: awake, alert  and oriented  Airway & Oxygen Therapy: Patient Spontanous Breathing and Patient connected to face mask oxygen  Post-op Assessment: Report given to RN and Post -op Vital signs reviewed and stable  Post vital signs: Reviewed and stable  Last Vitals:  Vitals Value Taken Time  BP    Temp    Pulse 86 09/10/2017 11:17 AM  Resp    SpO2 98 % 09/10/2017 11:17 AM  Vitals shown include unvalidated device data.  Last Pain:  Vitals:   09/10/17 0935  TempSrc: Oral  PainSc: 0-No pain         Complications: No apparent anesthesia complications

## 2017-09-10 NOTE — Op Note (Signed)
09/10/2017  11:07 AM  PATIENT:  Tamara Green  37 y.o. female  PRE-OPERATIVE DIAGNOSIS:  Undesired Fertility, CIN2 with positive ECC  POST-OPERATIVE DIAGNOSIS:  same  PROCEDURE:  Procedure(s): LAPAROSCOPIC TUBAL LIGATION - FILSHIE CLIPS (Bilateral) COLD KNIFE CONIZATION (N/A)  SURGEON:  Surgeon(s) and Role:    * Thijs Brunton C, MD - Primary  ANESTHESIA:   local and general   FINDINGS: omental adhesions to the anterior abdominal wall, fibroid uterus, 3rd degree uterine prolapse  EBL:  100 mL   BLOOD ADMINISTERED:none  DRAINS: none   LOCAL MEDICATIONS USED:  MARCAINE     SPECIMEN:  Source of Specimen:  cone biopsy and ECC  DISPOSITION OF SPECIMEN:  PATHOLOGY  COUNTS:  YES  TOURNIQUET:  * No tourniquets in log *  DICTATION: .Dragon Dictation  PLAN OF CARE: Discharge to home after PACU  PATIENT DISPOSITION:  PACU - hemodynamically stable.   Delay start of Pharmacological VTE agent (>24hrs) due to surgical blood loss or risk of bleeding: not applicable   The risks, benefits, alternatives of surgery were explained understood, accepted. All questions were answered. She understands the failure rate of 1/400. She declines alternative forms of birth control. Her urine pregnancy test was negative. She was taken to the operating room and general anesthesia was applied without complication. She was placed in dorsal lithotomy position. Her abdomen and vagina were prepped and draped in the usual sterile fashion. A time out procedure was done. A bimanual exam revealed a 8 week, size and shape mobile uterus with nonenlarged adnexa. There was vaginal laxity, a large patulous cervix with 3rd degree prolapse. She has a generous pubic arch. A Hulka manipulator was placed on the cervix. Gloves were changed and attention was turned to the abdomen. Approximately 10 mL of 0.5% Marcaine was used to infiltrate the subcutaneous tissue at the umbilicus. A vertical 1 cm incision was made. A  Veres needle was placed in the pelvis. Low-flow CO2 was used to insufflate the abdomen to approximately 3 L. After good pneumoperitoneum was established, a 11 mm trocar was placed. Laparoscopy confirmed correct placement. Omental adhesions were noted. She was placed in the Trendelenburg position. Patient abdominal pressure was always less than 15. Her ovaries and tubes appeared normal. There was no evidence of endometriosis. The oviducts were visually traced to their fimbriated end on each side. In the isthmic region of each oviduct, a Filshie clip was placed across the entire oviduct. There was no bleeding. The CO2 was allowed to escape from her abdomen. The umbilical fascia was closed with a figure of 0 Vicryl suture. No defects were palpable. The subcuticular closure was done with 4-0 Vicryl suture. The Hulka manipulator was removed.  I then proceeded with the cone biopsy portion of the case. A weighted speculum was placed in the posterior aspect the vagina and the cervix was visualized. Lugol's solution was generously applied to the cervix. There was a small area of non-staining tissue at the entrance of the cervical os.  The anterior lip of the cervix was grasped with a single-tooth tenaculum, and a paracervical block was done with 25 mL of 0.5% Marcaine. A cone shaped specimen of the cervix was removed and endocervical curettings were obtained. The cone bed was cauterized with the Bovie. Hemostasis was noted. A small piece of gelfoam was placed on the cone bed.  A pursestring closure of the cervix was done with a 0 Vicryl suture x 2.  Excellent hemostasis was noted. The instrument sponge,  and needle counts were correct. She was taken to the recovery room in stable condition. She tolerated the procedure well.

## 2017-09-10 NOTE — Progress Notes (Signed)
Dr. Hulan Fray in to see patient, aware pain still 8/10. MD states patient has allergy to percocet and she is not going to give her any for at home. Informed MD anesthesia ordered hydrocodone for one dose in PACU and Dr. Hulan Fray states to check that with anesthesia.

## 2017-09-10 NOTE — Progress Notes (Signed)
The patient requests to have hydrocodone, patient and husband state after patient had percocet has tried hydrocodone with no reactions. Myself and Patsy Lager RN spoke to Dr. Sabra Heck with anesthesia, informed 8/10. patient allergy to percocet being hives, pain currently  , per husband has had hydrocodone before, but Dr. Hulan Fray concerned about her having it. Dr. Sabra Heck states it is fine to give the patient hydrocodone 5/325mg  one tab. Will continue to monitor

## 2017-09-10 NOTE — Anesthesia Procedure Notes (Signed)
Procedure Name: Intubation Performed by: Verita Lamb, CRNA Pre-anesthesia Checklist: Patient identified, Emergency Drugs available, Suction available, Patient being monitored and Timeout performed Patient Re-evaluated:Patient Re-evaluated prior to induction Oxygen Delivery Method: Circle system utilized Preoxygenation: Pre-oxygenation with 100% oxygen Induction Type: IV induction Ventilation: Mask ventilation without difficulty Laryngoscope Size: Miller and 2 Grade View: Grade I Tube type: Oral Tube size: 7.0 mm Airway Equipment and Method: Stylet Placement Confirmation: ETT inserted through vocal cords under direct vision,  positive ETCO2,  CO2 detector and breath sounds checked- equal and bilateral Secured at: 22 cm Tube secured with: Tape Dental Injury: Teeth and Oropharynx as per pre-operative assessment

## 2017-09-11 ENCOUNTER — Encounter (HOSPITAL_BASED_OUTPATIENT_CLINIC_OR_DEPARTMENT_OTHER): Payer: Self-pay | Admitting: Obstetrics & Gynecology

## 2017-09-13 ENCOUNTER — Encounter (HOSPITAL_COMMUNITY): Payer: Self-pay | Admitting: Emergency Medicine

## 2017-09-13 ENCOUNTER — Ambulatory Visit (HOSPITAL_COMMUNITY)
Admission: EM | Admit: 2017-09-13 | Discharge: 2017-09-13 | Disposition: A | Discharge status: Home / Self Care | Payer: Medicaid Other | Attending: Family Medicine | Admitting: Family Medicine

## 2017-09-13 DIAGNOSIS — L249 Irritant contact dermatitis, unspecified cause: Secondary | ICD-10-CM | POA: Diagnosis not present

## 2017-09-13 MED ORDER — KETOROLAC TROMETHAMINE 60 MG/2ML IM SOLN
INTRAMUSCULAR | Status: AC
  Filled 2017-09-13: qty 2

## 2017-09-13 MED ORDER — CLOBETASOL PROPIONATE 0.05 % EX OINT: 1.0000 "application " | TOPICAL_OINTMENT | Freq: Two times a day (BID) | CUTANEOUS | 0 refills | Status: DC

## 2017-09-13 MED ORDER — KETOROLAC TROMETHAMINE 60 MG/2ML IM SOLN
60.0000 mg | Freq: Once | INTRAMUSCULAR | Status: AC
  Administered 2017-09-13: 60 mg via INTRAMUSCULAR

## 2017-09-13 MED ORDER — METHYLPREDNISOLONE 4 MG PO TBPK: ORAL_TABLET | ORAL | 0 refills | Status: DC

## 2017-09-13 NOTE — ED Triage Notes (Signed)
Pt here for rash and hives; pt sts from latex

## 2017-09-13 NOTE — ED Provider Notes (Signed)
[facility]    CSN: [id number] Arrival date & time: 09/13/17  1046     History   Chief Complaint Chief Complaint  Patient presents with  . Rash    HPI Tamara Green is a 37 y.o. female.   HPI  Patient is here for "an allergic reaction".  She has itching and swelling to the labia, rash along the perineum and around the rectum. He feels that this might be an allergic reaction to latex.  She had a surgical procedure n 09/10/2017.  She had tubal ligation and conization.  She suspects there may have been latex somewhere during her treatment.  No shortness of breath or rash elsewhere.  It is all localized to her perineal region.  She used some Benadryl cream, and some coconut oil to the area.  None of these have worked.  She has itching, fullness, discomfort.  No vaginal discharge.  She does still have some bleeding from the conization.  She still has some abdominal pain, also from her surgery.  She is taking ibuprofen for pain. Past Medical History:  Diagnosis Date  . Anemia    no current med.  Marland Kitchen Heartburn    no current med.  Marland Kitchen History of blood transfusion 01/2014  . Rash of vulva 09/02/2017  . Rheumatoid arthritis(714.0)    no current med.  . Sensitive skin     Patient Active Problem List   Diagnosis Date Noted  . CIN II (cervical intraepithelial neoplasia II) 07/18/2017  . HGSIL (high grade squamous intraepithelial lesion) on Pap smear of cervix 07/09/2017  . S/P cesarean section 12/11/2014  . Premature rupture of membranes 12/08/2014  . Advanced maternal age in multigravida 09/14/2014  . Symptomatic anemia 02/03/2014  . Rheumatoid arthritis ([place]) 02/03/2014  . Anemia 02/03/2014    Past Surgical History:  Procedure Laterality Date  . BREAST BIOPSY Right   . CERVICAL CERCLAGE  07/10/2000  . CERVICAL CERCLAGE N/A 09/17/2014   Procedure: CERCLAGE CERVICAL;  Surgeon: Shelly Bombard, MD;  Location: [facility];  Service: Gynecology;  Laterality: N/A;  .  CERVICAL CERCLAGE  10/30/2001  . CERVICAL CERCLAGE  05/02/2006  . CERVICAL CONIZATION W/BX N/A 09/10/2017   Procedure: COLD KNIFE CONIZATION;  Surgeon: Emily Filbert, MD;  Location: [facility];  Service: Gynecology;  Laterality: N/A;  . CESAREAN SECTION N/A 12/11/2014   Procedure: CESAREAN SECTION;  Surgeon: Frederico Hamman, MD;  Location: [facility];  Service: Obstetrics;  Laterality: N/A;  . LAPAROSCOPIC TUBAL LIGATION Bilateral 09/10/2017   Procedure: LAPAROSCOPIC TUBAL LIGATION - FILSHIE CLIPS;  Surgeon: Emily Filbert, MD;  Location: [facility];  Service: Gynecology;  Laterality: Bilateral;    OB History    Gravida  6   Para  4   Term  2   Preterm  2   AB  2   Living  4     SAB  2   TAB      Ectopic      Multiple  0   Live Births  4            Home Medications    Prior to Admission medications   Medication Sig Start Date End Date Taking? Authorizing Provider  Acetaminophen (TYLENOL ARTHRITIS PAIN PO) Take by mouth.    [provider]  clobetasol ointment (TEMOVATE) 4.74 % Apply 1 application topically 2 (two) times daily. 09/13/17   Raylene Everts, MD  ibuprofen (ADVIL,MOTRIN) 800 MG  tablet Take 1 tablet (800 mg total) by mouth every 8 (eight) hours as needed. 09/10/17   Emily Filbert, MD  methylPREDNISolone (MEDROL DOSEPAK) 4 MG TBPK tablet tad 09/13/17   Raylene Everts, MD    Family History Family History  Problem Relation Age of Onset  . Diabetes Mother     Social History Social History   Tobacco Use  . Smoking status: Former Smoker    Last attempt to quit: 01/09/2012    Years since quitting: 5.6  . Smokeless tobacco: Never Used  Substance Use Topics  . Alcohol use: Yes    Comment: occasionally  . Drug use: No     Allergies   Percocet [oxycodone-acetaminophen] and Latex   Review of Systems Review of Systems  Constitutional: Negative for chills and fever.  HENT: Negative for ear pain and sore throat.     Eyes: Negative for pain and visual disturbance.  Respiratory: Negative for cough and shortness of breath.   Cardiovascular: Negative for chest pain and palpitations.  Gastrointestinal: Negative for abdominal pain and vomiting.  Genitourinary: Positive for vaginal bleeding and vaginal pain. Negative for decreased urine volume, dysuria, hematuria and urgency.  Musculoskeletal: Negative for arthralgias and back pain.  Skin: Positive for rash. Negative for color change.  Neurological: Negative for seizures and syncope.  All other systems reviewed and are negative.    Physical Exam Triage Vital Signs ED Triage Vitals [09/13/17 1110]  Enc Vitals Group     BP (!) 143/81     Pulse Rate (!) 102     Resp 18     Temp 98.3 F (36.8 C)     Temp Source Oral     SpO2 100 %   No data found.  Updated Vital Signs BP (!) 143/81 (BP Location: Right Arm)   Pulse (!) 102   Temp 98.3 F (36.8 C) (Oral)   Resp 18   LMP 09/07/2017   SpO2 100%      Physical Exam  Constitutional: She appears well-developed and well-nourished. She appears distressed.  Appears uncomfortable.  Holding abdomen.  HENT:  Head: Normocephalic and atraumatic.  Mouth/Throat: Oropharynx is clear and moist.  Eyes: Pupils are equal, round, and reactive to light. Conjunctivae are normal.  Neck: Normal range of motion. Neck supple.  Cardiovascular: Normal rate, regular rhythm and normal heart sounds.  Pulmonary/Chest: Effort normal and breath sounds normal. No respiratory distress. She has no wheezes.  Abdominal: Soft. She exhibits no distension. There is tenderness.  Tenderness diffusely on lower abdomen.  No guarding or rebound  Genitourinary:  Genitourinary Comments: The labia are mildly swollen.  There is an erythematous confluent tiny vesicular rash covering perineum and perianal area.  Scant old blood/vaginal bleeding noted.  Musculoskeletal: Normal range of motion. She exhibits no edema.  Neurological: She is  alert.  Skin: Skin is warm and dry.  Psychiatric: She has a normal mood and affect. Her behavior is normal.     UC Treatments / Results  Labs (all labs ordered are listed, but only abnormal results are displayed) Labs Reviewed - No data to display  EKG None  Radiology No results found.  Procedures Procedures (including critical care time)  Medications Ordered in UC Medications  ketorolac (TORADOL) injection 60 mg (60 mg Intramuscular Given 09/13/17 1157)    Initial Impression / Assessment and Plan / UC Course  I have reviewed the triage vital signs and the nursing notes.  Pertinent labs & imaging results that were  available during my care of the patient were reviewed by me and considered in my medical decision making (see chart for details).     Told patient this does indeed look like an allergic reaction/contact dermatitis.  It may be to his soap or product that was used in the hospital, or at home.  I am going to treat her with a prednisone Oral medication and ointment.  She was given a shot of Toradol in the office because of her persistent crampy lower abdominal pain. Final Clinical Impressions(s) / UC Diagnoses   Final diagnoses:  Irritant contact dermatitis, unspecified trigger     Discharge Instructions     Take the Medrol Dosepak as prescribed Take all of day 1 today Use the clobetasol ointment twice a day May use cool compresses See your PCP or GYN if not better by Monday   ED Prescriptions    Medication Sig Dispense Auth. Provider   clobetasol ointment (TEMOVATE) 6.55 % Apply 1 application topically 2 (two) times daily. 30 g Raylene Everts, MD   methylPREDNISolone (MEDROL DOSEPAK) 4 MG TBPK tablet tad 21 tablet Raylene Everts, MD     Controlled Substance Prescriptions [place] Controlled Substance Registry consulted? Not Applicable   Raylene Everts, MD 09/13/17 1535

## 2017-09-13 NOTE — Discharge Instructions (Signed)
Take the Medrol Dosepak as prescribed Take all of day 1 today Use the clobetasol ointment twice a day May use cool compresses See your PCP or GYN if not better by Monday

## 2017-09-17 ENCOUNTER — Telehealth: Payer: Self-pay

## 2017-09-17 NOTE — Telephone Encounter (Signed)
Pt called today on Nurse VM at 10:29am stating that she had surgery on 09/10/17 with Dr. Hulan Fray, having constipation & she noticed after bowel movement that she passed some tissue from Vagina & she wants to know if this was from the Bx.

## 2017-09-18 ENCOUNTER — Other Ambulatory Visit: Payer: Self-pay

## 2017-09-18 ENCOUNTER — Emergency Department (HOSPITAL_COMMUNITY)
Admission: EM | Admit: 2017-09-18 | Discharge: 2017-09-18 | Disposition: A | Discharge status: Home / Self Care | Payer: Medicaid Other | Attending: Emergency Medicine | Admitting: Emergency Medicine

## 2017-09-18 ENCOUNTER — Encounter (HOSPITAL_COMMUNITY): Payer: Self-pay

## 2017-09-18 DIAGNOSIS — N939 Abnormal uterine and vaginal bleeding, unspecified: Secondary | ICD-10-CM | POA: Diagnosis present

## 2017-09-18 DIAGNOSIS — R739 Hyperglycemia, unspecified: Secondary | ICD-10-CM

## 2017-09-18 DIAGNOSIS — B373 Candidiasis of vulva and vagina: Secondary | ICD-10-CM | POA: Diagnosis not present

## 2017-09-18 DIAGNOSIS — Z91040 Latex allergy status: Secondary | ICD-10-CM | POA: Diagnosis not present

## 2017-09-18 DIAGNOSIS — R102 Pelvic and perineal pain: Secondary | ICD-10-CM | POA: Insufficient documentation

## 2017-09-18 DIAGNOSIS — Z87891 Personal history of nicotine dependence: Secondary | ICD-10-CM | POA: Diagnosis not present

## 2017-09-18 DIAGNOSIS — B3731 Acute candidiasis of vulva and vagina: Secondary | ICD-10-CM

## 2017-09-18 LAB — CBC
HCT: 30.8 % — ABNORMAL LOW (ref 36.0–46.0)
Hemoglobin: 8.6 g/dL — ABNORMAL LOW (ref 12.0–15.0)
MCH: 18.3 pg — AB (ref 26.0–34.0)
MCHC: 27.9 g/dL — ABNORMAL LOW (ref 30.0–36.0)
MCV: 65.5 fL — ABNORMAL LOW (ref 78.0–100.0)
PLATELETS: 690 10*3/uL — AB (ref 150–400)
RBC: 4.7 MIL/uL (ref 3.87–5.11)
RDW: 21.3 % — AB (ref 11.5–15.5)
WBC: 13.9 10*3/uL — ABNORMAL HIGH (ref 4.0–10.5)

## 2017-09-18 LAB — URINALYSIS, ROUTINE W REFLEX MICROSCOPIC
Bilirubin Urine: NEGATIVE
Ketones, ur: 5 mg/dL — AB
Nitrite: NEGATIVE
PH: 7 (ref 5.0–8.0)
Protein, ur: NEGATIVE mg/dL
Specific Gravity, Urine: 1.031 — ABNORMAL HIGH (ref 1.005–1.030)
WBC, UA: >50 WBC/hpf — ABNORMAL HIGH (ref 0–5)

## 2017-09-18 LAB — WET PREP, GENITAL
Clue Cells Wet Prep HPF POC: NONE SEEN
Sperm: NONE SEEN
TRICH WET PREP: NONE SEEN
Yeast Wet Prep HPF POC: NONE SEEN

## 2017-09-18 LAB — BASIC METABOLIC PANEL
ANION GAP: 11 (ref 5–15)
BUN: 10 mg/dL (ref 6–20)
CALCIUM: 9 mg/dL (ref 8.9–10.3)
CO2: 22 mmol/L (ref 22–32)
Chloride: 94 mmol/L — ABNORMAL LOW (ref 98–111)
Creatinine, Ser: 0.73 mg/dL (ref 0.44–1.00)
GLUCOSE: 733 mg/dL — AB (ref 70–99)
Potassium: 4.1 mmol/L (ref 3.5–5.1)
Sodium: 127 mmol/L — ABNORMAL LOW (ref 135–145)

## 2017-09-18 LAB — CBG MONITORING, ED
GLUCOSE-CAPILLARY: 299 mg/dL — AB (ref 70–99)
Glucose-Capillary: >600 mg/dL (ref 70–99)
Glucose-Capillary: 409 mg/dL — ABNORMAL HIGH (ref 70–99)

## 2017-09-18 LAB — I-STAT BETA HCG BLOOD, ED (MC, WL, AP ONLY)

## 2017-09-18 MED ORDER — SODIUM CHLORIDE 0.9 % IV BOLUS
1000.0000 mL | Freq: Once | INTRAVENOUS | Status: AC
  Administered 2017-09-18: 1000 mL via INTRAVENOUS

## 2017-09-18 MED ORDER — FLUCONAZOLE 150 MG PO TABS
150.0000 mg | ORAL_TABLET | Freq: Once | ORAL | Status: AC
  Administered 2017-09-18: 150 mg via ORAL
  Filled 2017-09-18: qty 1

## 2017-09-18 MED ORDER — METFORMIN HCL 500 MG PO TABS
500.0000 mg | ORAL_TABLET | Freq: Once | ORAL | Status: AC
  Administered 2017-09-18: 500 mg via ORAL
  Filled 2017-09-18: qty 1

## 2017-09-18 MED ORDER — FLUCONAZOLE 150 MG PO TABS: 150.0000 mg | ORAL_TABLET | ORAL | 0 refills | Status: AC

## 2017-09-18 MED ORDER — KETOROLAC TROMETHAMINE 15 MG/ML IJ SOLN
15.0000 mg | Freq: Once | INTRAMUSCULAR | Status: AC
  Administered 2017-09-18: 15 mg via INTRAVENOUS
  Filled 2017-09-18: qty 1

## 2017-09-18 MED ORDER — METFORMIN HCL 500 MG PO TABS: 500.0000 mg | ORAL_TABLET | Freq: Two times a day (BID) | ORAL | 0 refills | Status: DC

## 2017-09-18 NOTE — ED Provider Notes (Signed)
Patient care signed out to me at shift change by Consuello Bossier, PA-C.  Per [place]'s HPI,   "KEYSHA Green is a 37 y.o. female presenting for evaluation of vaginal bleeding, vaginal irritation, and lightheadedness.  Patient states she had a cervical cone biopsy on September 3.  Since then, she has been having persistent vaginal bleeding.  She had severe lower abdominal cramping, this is mildly improved.  Over the past several weeks, she has had worsening skin irritation surrounding the vagina.  She was seen at urgent care, started on steroids and a steroid cream, symptoms have worsened since.  Skin is very painful.  Today, patient had an episode where she felt like her heart was racing fast and felt lightheaded.  This is what prompted her visit today.  Patient denies lightheadedness currently, but states when she goes from sitting to standing she feels more symptomatic.  Patient reports she has a history of anemia, has needed transfusions in the past.  She has an appoint with her OB/GYN tomorrow.  She denies fevers, chills, chest pain, difficulty breathing, nausea, vomiting, abdominal pain.  She denies urinary frequency.  She reports burning when she pees, though states this is due to the skin irritation.  She denies vaginal discharge.  Patient reports a history of RA and heartburn, takes no medications daily. Very thirsty recently."  See her note for full details.  Briefly, during workup pt was found to have critically high glucose over 700. IVF initiated. No signs of DKA.  Once sugars are more improved, pt can be discharged on metformin 500mg  BID with plans to f/u closely with her PCP as an outpatient.  After 2 L normal saline, glucose improved to 299.  Patient felt much improved.  She requested her first dose of metformin be given in the ED.  She will fill Rx tomorrow.  With regard to vaginal complaints. Pt diagnosed with candidiasis. Pt will be given fluconazole. With regard to  bleeding, pt is chronically anemic but her cbc stable today. She has an appt with ob-gyn tomorrow for further evaluation of her vaginal irritation.  Plan was discussed with patient who understands need for follow-up.  Return precautions were discussed and patient also voices an understanding of this.  All questions were answered.  Patient stable for discharge.      Bishop Dublin 09/19/17 1650    Milton Ferguson, MD 09/20/17 1213

## 2017-09-18 NOTE — ED Provider Notes (Signed)
[facility] EMERGENCY DEPARTMENT Provider Note   CSN: [id number] Arrival date & time: 09/18/17  1215     History   Chief Complaint Chief Complaint  Patient presents with  . Vaginal Bleeding  . Allergic Reaction  . Near Syncope    HPI Tamara Green is a 37 y.o. female presenting for evaluation of vaginal bleeding, vaginal irritation, and lightheadedness.  Patient states she had a cervical cone biopsy on September 3.  Since then, she has been having persistent vaginal bleeding.  She had severe lower abdominal cramping, this is mildly improved.  Over the past several weeks, she has had worsening skin irritation surrounding the vagina.  She was seen at urgent care, started on steroids and a steroid cream, symptoms have worsened since.  Skin is very painful.  Today, patient had an episode where she felt like her heart was racing fast and felt lightheaded.  This is what prompted her visit today.  Patient denies lightheadedness currently, but states when she goes from sitting to standing she feels more symptomatic.  Patient reports she has a history of anemia, has needed transfusions in the past.  She has an appoint with her OB/GYN tomorrow.  She denies fevers, chills, chest pain, difficulty breathing, nausea, vomiting, abdominal pain.  She denies urinary frequency.  She reports burning when she pees, though states this is due to the skin irritation.  She denies vaginal discharge.  Patient reports a history of RA and heartburn, takes no medications daily. Very thirsty recently.  HPI  Past Medical History:  Diagnosis Date  . Anemia    no current med.  Marland Kitchen Heartburn    no current med.  Marland Kitchen History of blood transfusion 01/2014  . Rash of vulva 09/02/2017  . Rheumatoid arthritis(714.0)    no current med.  . Sensitive skin     Patient Active Problem List   Diagnosis Date Noted  . CIN II (cervical intraepithelial neoplasia II) 07/18/2017  . HGSIL (high grade  squamous intraepithelial lesion) on Pap smear of cervix 07/09/2017  . S/P cesarean section 12/11/2014  . Premature rupture of membranes 12/08/2014  . Advanced maternal age in multigravida 09/14/2014  . Symptomatic anemia 02/03/2014  . Rheumatoid arthritis ([place]) 02/03/2014  . Anemia 02/03/2014    Past Surgical History:  Procedure Laterality Date  . BREAST BIOPSY Right   . CERVICAL CERCLAGE  07/10/2000  . CERVICAL CERCLAGE N/A 09/17/2014   Procedure: CERCLAGE CERVICAL;  Surgeon: Shelly Bombard, MD;  Location: [facility];  Service: Gynecology;  Laterality: N/A;  . CERVICAL CERCLAGE  10/30/2001  . CERVICAL CERCLAGE  05/02/2006  . CERVICAL CONIZATION W/BX N/A 09/10/2017   Procedure: COLD KNIFE CONIZATION;  Surgeon: Emily Filbert, MD;  Location: [facility];  Service: Gynecology;  Laterality: N/A;  . CESAREAN SECTION N/A 12/11/2014   Procedure: CESAREAN SECTION;  Surgeon: Frederico Hamman, MD;  Location: [facility];  Service: Obstetrics;  Laterality: N/A;  . LAPAROSCOPIC TUBAL LIGATION Bilateral 09/10/2017   Procedure: LAPAROSCOPIC TUBAL LIGATION - FILSHIE CLIPS;  Surgeon: Emily Filbert, MD;  Location: [facility];  Service: Gynecology;  Laterality: Bilateral;     OB History    Gravida  6   Para  4   Term  2   Preterm  2   AB  2   Living  4     SAB  2   TAB      Ectopic  Multiple  0   Live Births  4            Home Medications    Prior to Admission medications   Medication Sig Start Date End Date Taking? Authorizing Provider  Acetaminophen (TYLENOL ARTHRITIS PAIN PO) Take by mouth.    [provider]  clobetasol ointment (TEMOVATE) 8.18 % Apply 1 application topically 2 (two) times daily. 09/13/17   Raylene Everts, MD  fluconazole (DIFLUCAN) 150 MG tablet Take 1 tablet (150 mg total) by mouth every 3 (three) days for 12 days. 09/18/17 09/30/17  Joelee Snoke, PA-C  ibuprofen (ADVIL,MOTRIN) 800 MG tablet Take 1 tablet (800 mg  total) by mouth every 8 (eight) hours as needed. 09/10/17   Emily Filbert, MD  metFORMIN (GLUCOPHAGE) 500 MG tablet Take 1 tablet (500 mg total) by mouth 2 (two) times daily with a meal. 09/18/17   Bridgitte Felicetti, PA-C  methylPREDNISolone (MEDROL DOSEPAK) 4 MG TBPK tablet tad 09/13/17   Raylene Everts, MD    Family History Family History  Problem Relation Age of Onset  . Diabetes Mother     Social History Social History   Tobacco Use  . Smoking status: Former Smoker    Last attempt to quit: 01/09/2012    Years since quitting: 5.6  . Smokeless tobacco: Never Used  Substance Use Topics  . Alcohol use: Yes    Comment: occasionally  . Drug use: No     Allergies   Percocet [oxycodone-acetaminophen] and Latex   Review of Systems Review of Systems  Endocrine: Positive for polydipsia.  Genitourinary: Positive for vaginal bleeding.  Skin: Positive for rash.  Neurological: Positive for light-headedness (resolved).  All other systems reviewed and are negative.    Physical Exam Updated Vital Signs BP 136/83   Pulse 82   Temp 98.5 F (36.9 C) (Oral)   Resp 12   Ht 5\' 8"  (1.727 m)   Wt 93.9 kg   LMP 09/07/2017   SpO2 100%   BMI 31.47 kg/m   Physical Exam  Constitutional: She is oriented to person, place, and time. She appears well-developed and well-nourished. No distress.  Appears in no distress.  HENT:  Head: Normocephalic and atraumatic.  Eyes: Pupils are equal, round, and reactive to light. Conjunctivae and EOM are normal.  Neck: Normal range of motion. Neck supple.  Cardiovascular: Normal rate, regular rhythm and intact distal pulses.  Pulmonary/Chest: Effort normal and breath sounds normal. No respiratory distress. She has no wheezes.  Abdominal: Soft. She exhibits no distension and no mass. There is no tenderness. There is no rebound and no guarding.  Soft without rigidity, guarding, distention.  Negative rebound.  No signs of peritonitis.  Genitourinary:  Pelvic exam was performed with patient supine. There is rash and tenderness on the right labia. There is rash and tenderness on the left labia. Cervix exhibits no discharge. There is bleeding in the vagina.  Genitourinary Comments: Chaperone present.  Significant swelling and irritation of the inguinal folds, labial lips, and mons.  Labial lips are beefy red. Minimal vaginal bleeding.  No obvious discharge.  Musculoskeletal: Normal range of motion.  Neurological: She is alert and oriented to person, place, and time.  Skin: Skin is warm and dry. Capillary refill takes less than 2 seconds.  Psychiatric: She has a normal mood and affect.  Nursing note and vitals reviewed.    ED Treatments / Results  Labs (all labs ordered are listed, but only abnormal results are  displayed) Labs Reviewed  WET PREP, GENITAL - Abnormal; Notable for the following components:      Result Value   WBC, Wet Prep HPF POC MANY (*)    All other components within normal limits  BASIC METABOLIC PANEL - Abnormal; Notable for the following components:   Sodium 127 (*)    Chloride 94 (*)    Glucose, Bld 733 (*)    All other components within normal limits  CBC - Abnormal; Notable for the following components:   WBC 13.9 (*)    Hemoglobin 8.6 (*)    HCT 30.8 (*)    MCV 65.5 (*)    MCH 18.3 (*)    MCHC 27.9 (*)    RDW 21.3 (*)    Platelets 690 (*)    All other components within normal limits  URINALYSIS, ROUTINE W REFLEX MICROSCOPIC - Abnormal; Notable for the following components:   APPearance HAZY (*)    Specific Gravity, Urine 1.031 (*)    Glucose, UA >=500 (*)    Hgb urine dipstick LARGE (*)    Ketones, ur 5 (*)    Leukocytes, UA LARGE (*)    WBC, UA >50 (*)    Bacteria, UA FEW (*)    All other components within normal limits  CBG MONITORING, ED - Abnormal; Notable for the following components:   Glucose-Capillary >600 (*)    All other components within normal limits  CBG MONITORING, ED - Abnormal;  Notable for the following components:   Glucose-Capillary >600 (*)    All other components within normal limits  I-STAT BETA HCG BLOOD, ED ([facility], [facility], [facility] ONLY)  GC/CHLAMYDIA PROBE AMP ([place]) NOT AT [facility]    EKG None  Radiology No results found.  Procedures Procedures (including critical care time)  Medications Ordered in ED Medications  sodium chloride 0.9 % bolus 1,000 mL (1,000 mLs Intravenous New Bag/Given 09/18/17 1500)  fluconazole (DIFLUCAN) tablet 150 mg (has no administration in time range)  ketorolac (TORADOL) 15 MG/ML injection 15 mg (15 mg Intravenous Given 09/18/17 1514)     Initial Impression / Assessment and Plan / ED Course  I have reviewed the triage vital signs and the nursing notes.  Pertinent labs & imaging results that were available during my care of the patient were reviewed by me and considered in my medical decision making (see chart for details).     Patient presenting for evaluation of vaginal irritation and lightheadedness.  Initial evaluation reassuring, patient is afebrile not tachycardic.  Appears nontoxic.  Pain has been improving since her biopsy, although she is still having vaginal bleeding.  She is concerned about anemia, will obtain lab work for further evaluation.  Pelvic exam shows beefy red and irritated inguinal regions and labial lips.  Symptoms worsened on steroids, concern for possible yeast/candidal infection.  Orthostatics ordered, symptoms are worse when she goes from sitting to standing.  Toradol for pain.  Labs concerning, patient with critically high glucose, over 700.  IV fluids started.  No signs of DKA.  Corrected sodium normal.  Wet prep negative.  As patient has hyperglycemia, this is likely related to her persistent yeast/candidal infection.  Patient reports improvement of symptoms with Toradol and fluids.  Lightheadedness likely related to elevated CBG.  On further evaluation/history taking, patient reports over the past year  she has lost 70 pounds, and is "always thirsty."  She has not had her blood sugar evaluated for several years, about 15 years ago she was told  she was prediabetic.  This has never been followed up on.  Discussed findings with patient.  Discussed that I will start her on metformin twice daily and she needs to follow-up with her primary care regarding diabetes.  Discussed treatment with fluconazole for presumed yeast/candidal infection.  First dose given in the ER.  Prescription for following doses given.    Patient signed out to Axel Filler, PA-C for follow-up on CBG trending, as initial bolus is still being given.  If patient needs repeat boluses, continue until CBG has improved.  If CBG does not improve, consider need for admission.  Final Clinical Impressions(s) / ED Diagnoses   Final diagnoses:  Hyperglycemia  Vaginal candidiasis    ED Discharge Orders         Ordered    fluconazole (DIFLUCAN) 150 MG tablet  every 72 hours     09/18/17 1524    metFORMIN (GLUCOPHAGE) 500 MG tablet  2 times daily with meals     09/18/17 1524           Lurie Mullane, PA-C 09/18/17 [place], [place], DO 09/18/17 2139

## 2017-09-18 NOTE — Discharge Instructions (Signed)
There is information about diabetes in the paperwork, including exercise and diet.  Start taking metformin 2 times a day to help regulate your blood sugar.  It is very important that you follow up with your primary care doctor about this.  Follow up with your ob/gyn tomorrow to discuss your vaginal symptoms.  Return to the ER with any new, worsening, or concerning symptoms.

## 2017-09-18 NOTE — ED Triage Notes (Signed)
Pt. Had tubal ligation on September 3rd, with a cone biopsy.  Before the surgery pt. Developed a mild vaginal rash but after the surgery the rash has become worse, vaginal area , hives and radiates into her rectum.  She reports that the rash is red, swollen,  She also has had vaginal bleeding for 2weeks.  She went to work today and has a period of her heart racing and she felt weak and near syncope.  She called her MD and the instructed her to come to [place] and she has an appointment tomorrow.  She went to urgent care on 9/6 and was given prednisone and steroid cream , but it has made the rash worse.  Pt. Is alert and oriented X4.  Pt. Also has n/v

## 2017-09-19 ENCOUNTER — Ambulatory Visit: Payer: Medicaid Other | Admitting: Obstetrics & Gynecology

## 2017-09-19 ENCOUNTER — Encounter: Payer: Self-pay | Admitting: Obstetrics & Gynecology

## 2017-09-19 VITALS — BP 137/75 | HR 92 | Wt 202.3 lb

## 2017-09-19 DIAGNOSIS — F439 Reaction to severe stress, unspecified: Secondary | ICD-10-CM

## 2017-09-19 DIAGNOSIS — D069 Carcinoma in situ of cervix, unspecified: Secondary | ICD-10-CM

## 2017-09-19 DIAGNOSIS — Z9889 Other specified postprocedural states: Secondary | ICD-10-CM

## 2017-09-19 LAB — GC/CHLAMYDIA PROBE AMP (~~LOC~~) NOT AT ARMC
Chlamydia: NEGATIVE
NEISSERIA GONORRHEA: NEGATIVE

## 2017-09-19 NOTE — Progress Notes (Signed)
   Subjective:    Patient ID: Tamara Green, female    DOB: June 24, 1980, 37 y.o.   MRN: [id number]  HPI 37 yo married P2 here a week after a CKC and BTL. She has been seen at Urgent Care and then the ED with vulvar rash. At Urgent Care, they diagnosed an allergic reaction and put her on steroids. She then went to the ER who diagnosed her with yeast and treated her with diflucan. At the ED, her sugar was 733 and she was diagnosed with DM.  She is still having brownish liquid blood. She denies any fevers.   Review of Systems     Objective:   Physical Exam Teary, Breathing, conversing, and ambulating normally Well nourished, well hydrated Black female, no apparent distress Cervix- healing, no active bleeding other than liquid brown discharge Incision in umbilicus- healing well Vulva/perianal area- YEAST    Assessment & Plan:  Post CKC- CIN3 noted with clear margins Reassurance given, Rec continued pelvic rest Yeast- she has been taking diflucan and has refills New onset DM- She has an appt with her primary care MD Stress- appt with Tamara Green in the near future

## 2017-09-20 ENCOUNTER — Institutional Professional Consult (permissible substitution): Payer: Medicaid Other

## 2017-09-20 NOTE — BH Specialist Note (Deleted)
Integrated Behavioral Health Initial Visit  MRN: [id number] Name: Tamara Green  Number of [facility] Clinician visits:: 1/6 Session Start time: ***  Session End time: *** Total time: {IBH Total Time:21014050}  Type of Service: [place] Interpretor:No. Interpretor Name and Language: n/a   Warm Hand Off Completed.       SUBJECTIVE: Tamara Green is a 37 y.o. female accompanied by {CHL AMB ACCOMPANIED VO:5929244628} Patient was referred by Clovia Cuff, MD for stress. Patient reports the following symptoms/concerns: *** Duration of problem: ***; Severity of problem: {Mild/Moderate/Severe:20260}  OBJECTIVE: Mood: {BHH MOOD:22306} and Affect: {BHH AFFECT:22307} Risk of harm to self or others: {CHL AMB BH Suicide Current Mental Status:21022748}  LIFE CONTEXT: Family and Social: *** School/Work: *** Self-Care: *** Life Changes: ***  GOALS ADDRESSED: Patient will: 1. Reduce symptoms of: {IBH Symptoms:21014056} 2. Increase knowledge and/or ability of: {IBH Patient Tools:21014057}  3. Demonstrate ability to: {IBH Goals:21014053}  INTERVENTIONS: Interventions utilized: {IBH Interventions:21014054}  Standardized Assessments completed: {IBH Screening Tools:21014051}  ASSESSMENT: Patient currently experiencing ***.   Patient may benefit from ***.  PLAN: 1. Follow up with behavioral health clinician on : *** 2. Behavioral recommendations: *** 3. Referral(s): {IBH Referrals:21014055} 4. "From scale of 1-10, how likely are you to follow plan?": ***  Garlan Fair, LCSW  Depression screen [facility] 2/9 09/19/2017 07/18/2017 11/30/2014 10/22/2014 10/05/2014  Decreased Interest 0 0 0 0 0  Down, Depressed, Hopeless 0 0 0 0 0  PHQ - 2 Score 0 0 0 0 0  Altered sleeping 0 0 - - -  Tired, decreased energy 0 0 - - -  Change in appetite 0 0 - - -  Feeling bad or failure about yourself  0 0 - - -  Trouble  concentrating 0 0 - - -  Moving slowly or fidgety/restless 0 0 - - -  Suicidal thoughts 0 0 - - -  PHQ-9 Score 0 0 - - -   GAD 7 : Generalized Anxiety Score 09/19/2017 07/18/2017  Nervous, Anxious, on Edge 0 0  Control/stop worrying 0 0  Worry too much - different things 0 0  Trouble relaxing 0 0  Restless 0 0  Easily annoyed or irritable 0 1  Afraid - awful might happen 0 0  Total GAD 7 Score 0 1

## 2017-09-23 ENCOUNTER — Ambulatory Visit: Payer: Medicaid Other | Admitting: Obstetrics & Gynecology

## 2017-12-02 ENCOUNTER — Other Ambulatory Visit: Payer: Self-pay

## 2018-02-21 ENCOUNTER — Emergency Department (HOSPITAL_COMMUNITY): Payer: Medicaid Other

## 2018-02-21 ENCOUNTER — Other Ambulatory Visit: Payer: Self-pay | Admitting: Obstetrics & Gynecology

## 2018-02-21 ENCOUNTER — Observation Stay (HOSPITAL_COMMUNITY)
Admission: EM | Admit: 2018-02-21 | Discharge: 2018-02-22 | Disposition: A | Discharge status: Home / Self Care | Payer: Medicaid Other | Attending: Internal Medicine | Admitting: Internal Medicine

## 2018-02-21 ENCOUNTER — Other Ambulatory Visit: Payer: Self-pay

## 2018-02-21 ENCOUNTER — Observation Stay (HOSPITAL_COMMUNITY): Payer: Medicaid Other

## 2018-02-21 ENCOUNTER — Encounter (HOSPITAL_COMMUNITY): Payer: Self-pay | Admitting: Emergency Medicine

## 2018-02-21 DIAGNOSIS — D649 Anemia, unspecified: Principal | ICD-10-CM | POA: Diagnosis present

## 2018-02-21 DIAGNOSIS — Z7984 Long term (current) use of oral hypoglycemic drugs: Secondary | ICD-10-CM | POA: Diagnosis not present

## 2018-02-21 DIAGNOSIS — E669 Obesity, unspecified: Secondary | ICD-10-CM | POA: Insufficient documentation

## 2018-02-21 DIAGNOSIS — R531 Weakness: Secondary | ICD-10-CM | POA: Diagnosis present

## 2018-02-21 DIAGNOSIS — D069 Carcinoma in situ of cervix, unspecified: Secondary | ICD-10-CM | POA: Diagnosis not present

## 2018-02-21 DIAGNOSIS — R42 Dizziness and giddiness: Secondary | ICD-10-CM | POA: Insufficient documentation

## 2018-02-21 DIAGNOSIS — N938 Other specified abnormal uterine and vaginal bleeding: Secondary | ICD-10-CM | POA: Diagnosis present

## 2018-02-21 DIAGNOSIS — Z87891 Personal history of nicotine dependence: Secondary | ICD-10-CM | POA: Diagnosis not present

## 2018-02-21 DIAGNOSIS — E119 Type 2 diabetes mellitus without complications: Secondary | ICD-10-CM | POA: Insufficient documentation

## 2018-02-21 DIAGNOSIS — M069 Rheumatoid arthritis, unspecified: Secondary | ICD-10-CM | POA: Diagnosis not present

## 2018-02-21 HISTORY — DX: Type 2 diabetes mellitus with other specified complication: E11.69

## 2018-02-21 HISTORY — DX: Obesity, unspecified: E66.9

## 2018-02-21 LAB — CBC
HCT: 26.2 % — ABNORMAL LOW (ref 36.0–46.0)
Hemoglobin: 7.3 g/dL — ABNORMAL LOW (ref 12.0–15.0)
MCH: 17.4 pg — ABNORMAL LOW (ref 26.0–34.0)
MCHC: 27.9 g/dL — ABNORMAL LOW (ref 30.0–36.0)
MCV: 62.5 fL — ABNORMAL LOW (ref 80.0–100.0)
Platelets: 287 10*3/uL (ref 150–400)
RBC: 4.19 MIL/uL (ref 3.87–5.11)
RDW: 27.2 % — ABNORMAL HIGH (ref 11.5–15.5)
WBC: 10.1 10*3/uL (ref 4.0–10.5)
nRBC: 0.2 % (ref 0.0–0.2)

## 2018-02-21 LAB — URINALYSIS, ROUTINE W REFLEX MICROSCOPIC
BILIRUBIN URINE: NEGATIVE
Glucose, UA: NEGATIVE mg/dL
Hgb urine dipstick: NEGATIVE
KETONES UR: NEGATIVE mg/dL
Leukocytes,Ua: NEGATIVE
NITRITE: NEGATIVE
Protein, ur: NEGATIVE mg/dL
SPECIFIC GRAVITY, URINE: 1.005 (ref 1.005–1.030)
pH: 7 (ref 5.0–8.0)

## 2018-02-21 LAB — COMPREHENSIVE METABOLIC PANEL
ALK PHOS: 77 U/L (ref 38–126)
ALT: 15 U/L (ref 0–44)
ANION GAP: 12 (ref 5–15)
AST: 17 U/L (ref 15–41)
Albumin: 3.5 g/dL (ref 3.5–5.0)
BUN: 10 mg/dL (ref 6–20)
CALCIUM: 8.9 mg/dL (ref 8.9–10.3)
CO2: 20 mmol/L — AB (ref 22–32)
Chloride: 105 mmol/L (ref 98–111)
Creatinine, Ser: 0.74 mg/dL (ref 0.44–1.00)
GFR calc non Af Amer: >60 mL/min (ref >60)
Glucose, Bld: 139 mg/dL — ABNORMAL HIGH (ref 70–99)
POTASSIUM: 3.8 mmol/L (ref 3.5–5.1)
SODIUM: 137 mmol/L (ref 135–145)
TOTAL PROTEIN: 8.2 g/dL — AB (ref 6.5–8.1)
Total Bilirubin: 0.3 mg/dL (ref 0.3–1.2)

## 2018-02-21 LAB — CBC WITH DIFFERENTIAL/PLATELET
ABS IMMATURE GRANULOCYTES: 0 10*3/uL (ref 0.00–0.07)
BASOS PCT: 3 %
Basophils Absolute: 0.2 10*3/uL — ABNORMAL HIGH (ref 0.0–0.1)
EOS ABS: 0.1 10*3/uL (ref 0.0–0.5)
Eosinophils Relative: 1 %
HCT: 23 % — ABNORMAL LOW (ref 36.0–46.0)
HEMOGLOBIN: 5.9 g/dL — AB (ref 12.0–15.0)
LYMPHS ABS: 1.4 10*3/uL (ref 0.7–4.0)
Lymphocytes Relative: 17 %
MCH: 15.4 pg — AB (ref 26.0–34.0)
MCHC: 25.7 g/dL — AB (ref 30.0–36.0)
MCV: 60.1 fL — ABNORMAL LOW (ref 80.0–100.0)
MONO ABS: 0.2 10*3/uL (ref 0.1–1.0)
MONOS PCT: 3 %
NEUTROS ABS: 6.2 10*3/uL (ref 1.7–7.7)
NRBC: 0 /100{WBCs}
Neutrophils Relative %: 76 %
PLATELETS: 307 10*3/uL (ref 150–400)
RBC: 3.83 MIL/uL — ABNORMAL LOW (ref 3.87–5.11)
RDW: 23.1 % — AB (ref 11.5–15.5)
WBC: 8.2 10*3/uL (ref 4.0–10.5)
nRBC: 0 % (ref 0.0–0.2)

## 2018-02-21 LAB — GLUCOSE, CAPILLARY
GLUCOSE-CAPILLARY: 107 mg/dL — AB (ref 70–99)
Glucose-Capillary: 100 mg/dL — ABNORMAL HIGH (ref 70–99)
Glucose-Capillary: 141 mg/dL — ABNORMAL HIGH (ref 70–99)
Glucose-Capillary: 133 mg/dL — ABNORMAL HIGH (ref 70–99)

## 2018-02-21 LAB — LIPASE, BLOOD: Lipase: 30 U/L (ref 11–51)

## 2018-02-21 LAB — HEMOGLOBIN A1C
Hgb A1c MFr Bld: 6.2 % — ABNORMAL HIGH (ref 4.8–5.6)
Mean Plasma Glucose: 131.24 mg/dL

## 2018-02-21 LAB — POC OCCULT BLOOD, ED: FECAL OCCULT BLD: NEGATIVE

## 2018-02-21 LAB — CBG MONITORING, ED: GLUCOSE-CAPILLARY: 131 mg/dL — AB (ref 70–99)

## 2018-02-21 LAB — PREPARE RBC (CROSSMATCH)

## 2018-02-21 LAB — I-STAT BETA HCG BLOOD, ED (MC, WL, AP ONLY): I-stat hCG, quantitative: <5 m[IU]/mL (ref <5)

## 2018-02-21 MED ORDER — ONDANSETRON HCL 4 MG PO TABS: 4.0000 mg | ORAL_TABLET | Freq: Four times a day (QID) | ORAL | Status: DC | PRN

## 2018-02-21 MED ORDER — SODIUM CHLORIDE 0.9% IV SOLUTION
Freq: Once | INTRAVENOUS | Status: AC
  Administered 2018-02-21: 22:00:00 via INTRAVENOUS

## 2018-02-21 MED ORDER — INSULIN ASPART 100 UNIT/ML ~~LOC~~ SOLN
0.0000 [IU] | Freq: Three times a day (TID) | SUBCUTANEOUS | Status: DC
  Administered 2018-02-21 – 2018-02-22 (×2): 2 [IU] via SUBCUTANEOUS

## 2018-02-21 MED ORDER — MEGESTROL ACETATE 40 MG PO TABS: 40.0000 mg | ORAL_TABLET | Freq: Two times a day (BID) | ORAL | 5 refills | Status: DC

## 2018-02-21 MED ORDER — SODIUM CHLORIDE 0.9 % IV SOLN: 10.0000 mL/h | Freq: Once | INTRAVENOUS | Status: DC

## 2018-02-21 MED ORDER — ACETAMINOPHEN 325 MG PO TABS: 650.0000 mg | ORAL_TABLET | Freq: Four times a day (QID) | ORAL | Status: DC | PRN

## 2018-02-21 MED ORDER — ONDANSETRON HCL 4 MG/2ML IJ SOLN
4.0000 mg | Freq: Once | INTRAMUSCULAR | Status: AC
  Administered 2018-02-21: 4 mg via INTRAVENOUS
  Filled 2018-02-21: qty 2

## 2018-02-21 MED ORDER — SODIUM CHLORIDE 0.9 % IV BOLUS
1000.0000 mL | Freq: Once | INTRAVENOUS | Status: AC
  Administered 2018-02-21: 1000 mL via INTRAVENOUS

## 2018-02-21 MED ORDER — ONDANSETRON HCL 4 MG/2ML IJ SOLN: 4.0000 mg | Freq: Four times a day (QID) | INTRAMUSCULAR | Status: DC | PRN

## 2018-02-21 MED ORDER — ACETAMINOPHEN 650 MG RE SUPP: 650.0000 mg | Freq: Four times a day (QID) | RECTAL | Status: DC | PRN

## 2018-02-21 NOTE — ED Triage Notes (Signed)
Pt arrives from home c/o dizziness and vomiting x2 days. Pt. Saw PCP Monday and informed of hemoglobin of 5.0. Pt. States dizziness was so bad today she had difficulty getting out of bed.
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
## 2018-02-21 NOTE — Progress Notes (Signed)
Megace 40 mg BID prescribed for when she starts her period. She is currently in [facility], getting a blood transfusion due to menorrhagia and a hbg of 5.9.
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
## 2018-02-21 NOTE — ED Notes (Signed)
Pt. Transported to xray 

## 2018-02-21 NOTE — ED Notes (Signed)
Date and time results received: 02/21/18  (use smartphrase ".now" to insert current time)  Test: Hemoglobin Critical Value: 5.9  Name of Provider Notified: [facility]

## 2018-02-22 DIAGNOSIS — D649 Anemia, unspecified: Secondary | ICD-10-CM

## 2018-02-22 LAB — CBC
HCT: 28.3 % — ABNORMAL LOW (ref 36.0–46.0)
Hemoglobin: 7.8 g/dL — ABNORMAL LOW (ref 12.0–15.0)
MCH: 18.5 pg — ABNORMAL LOW (ref 26.0–34.0)
MCHC: 27.6 g/dL — ABNORMAL LOW (ref 30.0–36.0)
MCV: 67.1 fL — ABNORMAL LOW (ref 80.0–100.0)
Platelets: 287 10*3/uL (ref 150–400)
RBC: 4.22 MIL/uL (ref 3.87–5.11)
RDW: 28.8 % — ABNORMAL HIGH (ref 11.5–15.5)
WBC: 9.4 10*3/uL (ref 4.0–10.5)
nRBC: 0 % (ref 0.0–0.2)

## 2018-02-22 LAB — TYPE AND SCREEN
ABO/RH(D): O NEG
Antibody Screen: NEGATIVE
UNIT DIVISION: 0
Unit division: 0
Unit division: 0

## 2018-02-22 LAB — BPAM RBC
BLOOD PRODUCT EXPIRATION DATE: 202002272359
BLOOD PRODUCT EXPIRATION DATE: 202002292359
Blood Product Expiration Date: 202003022359
ISSUE DATE / TIME: 202002140920
ISSUE DATE / TIME: 202002142127
ISSUE DATE / TIME: 202002141311
UNIT TYPE AND RH: 9500
Unit Type and Rh: 9500
Unit Type and Rh: 9500

## 2018-02-22 LAB — BASIC METABOLIC PANEL WITH GFR
Anion gap: 9 (ref 5–15)
BUN: 8 mg/dL (ref 6–20)
CO2: 22 mmol/L (ref 22–32)
Calcium: 8.4 mg/dL — ABNORMAL LOW (ref 8.9–10.3)
Chloride: 106 mmol/L (ref 98–111)
Creatinine, Ser: 0.73 mg/dL (ref 0.44–1.00)
GFR calc Af Amer: >60 mL/min
GFR calc non Af Amer: >60 mL/min
Glucose, Bld: 131 mg/dL — ABNORMAL HIGH (ref 70–99)
Potassium: 3.8 mmol/L (ref 3.5–5.1)
Sodium: 137 mmol/L (ref 135–145)

## 2018-02-22 LAB — GLUCOSE, CAPILLARY
Glucose-Capillary: 120 mg/dL — ABNORMAL HIGH (ref 70–99)
Glucose-Capillary: 121 mg/dL — ABNORMAL HIGH (ref 70–99)

## 2018-02-22 LAB — HEMOGLOBIN AND HEMATOCRIT, BLOOD
HCT: 29 % — ABNORMAL LOW (ref 36.0–46.0)
Hemoglobin: 8.3 g/dL — ABNORMAL LOW (ref 12.0–15.0)

## 2018-02-22 LAB — HIV ANTIBODY (ROUTINE TESTING W REFLEX): HIV Screen 4th Generation wRfx: NONREACTIVE

## 2018-02-22 MED ORDER — SODIUM CHLORIDE 0.9 % IV SOLN
510.0000 mg | Freq: Once | INTRAVENOUS | Status: AC
  Administered 2018-02-22: 510 mg via INTRAVENOUS
  Filled 2018-02-22: qty 17

## 2018-02-22 NOTE — Progress Notes (Addendum)
Pt discharge education provided at bedside. Education provided on new medication. Pt verbalizes understanding. Pt has all belongings. IV removed, catheter intact. Pt discharged via wheelchair with NT

## 2018-02-22 NOTE — Discharge Summary (Signed)
Physician Discharge Summary  MAHIKA VANVOORHIS OYD:[id number] DOB: 30-Mar-1980 DOA: 02/21/2018  PCP: Lin Landsman, MD  Admit date: 02/21/2018 Discharge date: 02/22/2018  Admitted From: home Discharge disposition: home   Recommendations for Outpatient Follow-Up:   1. Patient to start megase when menstruation starts--- close GYN follow up 2. Cbc 1 week 3. Consideration of endometrial biopsy   Discharge Diagnosis:   Principal Problem:   Symptomatic anemia Active Problems:   Rheumatoid arthritis ([place])   CIN III with severe dysplasia   Dysfunctional uterine bleeding    Discharge Condition: Improved.  Diet recommendation:   Carbohydrate-modified.  Wound care: None.  Code status: Full.   History of Present Illness:   Tamara Green is a 38 y.o. female with medical history significant of RA not on medications; DM; and anemia with h/o transfusion presenting with dizziness and weakness.  She has been feeling very dizzy and not feeling good.  She tried to eat and threw up.  She was feeling weak yesterday too and it has been progressive.  LMP was about a month ago - she has heavy bleeding; she is using a flex ring instead of a tampon.  She usually changes tampons (even super) every 90 minutes.  Heavy bleeding for 5 days and additional 2 days.  She is done having babies, had BTL last summer.  She wants to discuss this with Dr. Hulan Fray soon.  She has also had some abnormal cells and so TAH probably is her best option.   Hospital Course by Problem:   Symptomatic anemia -Patient with h/o menorrhagia with resultant anemia requiring transfusion. -Transfused 2 units PRBC   DUB -Patient was discussed with Dr. Hulan Fray. -Given that her menstrual cycle is anticipated soon, she is very likely to have this issue again. -Will rx Megace 40 mg BID to begin when she starts her next period - Dr. Hulan Fray has called this in to her pharmacy - GYN [place] to assess uterus: There is a  complex cystic mass in the left ovary, probably a corpus luteum cyst or hemorrhagic cyst. A small neoplasm is not excluded on this single study. Recommend a follow-up ultrasound in 6-12 weeks to ensure resolution. 2. The endometrial stripe thickness is 16.1 mm which is abnormal in a patient of this age. If bleeding remains unresponsive to hormonal or medical therapy, focal lesion work-up with sonohysterogram should be considered. Endometrial biopsy should also be considered in pre-menopausal patients at high risk for endometrial carcinoma. -Dr. Hulan Fray will see the patient to discuss a plan for definitive treatment on March 3 at 11:15.   CIN II on cervical biopsy -s/p cold knife cone at the time of her BTL in 9/19  RA -She is not taking medications for this issue at this time.  DM uncontrolled -continue home meds  Medical Consultants:   GYN (phone)   Discharge Exam:   Vitals:   02/22/18 0016 02/22/18 0652  BP: 126/76 118/65  Pulse: 71 65  Resp: 18 18  Temp: 98.8 F (37.1 C) 99 F (37.2 C)  SpO2: 99% 100%   Vitals:   02/21/18 2130 02/21/18 2155 02/22/18 0016 02/22/18 0652  BP: 129/78 120/75 126/76 118/65  Pulse: 73 75 71 65  Resp: 18 18 18 18   Temp: 98.3 F (36.8 C) 99.2 F (37.3 C) 98.8 F (37.1 C) 99 F (37.2 C)  TempSrc: Oral Oral Oral Oral  SpO2: 100%  99% 100%  Weight:      Height:  General exam: Appears calm and comfortable.  The results of significant diagnostics from this hospitalization (including imaging, microbiology, ancillary and laboratory) are listed below for reference.     Procedures and Diagnostic Studies:   Dg Chest 2 View  Result Date: 02/21/2018 CLINICAL DATA:  Dizziness and headache EXAM: CHEST - 2 VIEW COMPARISON:  April 19, 2010 FINDINGS: There is no edema or consolidation. Heart is enlarged with pulmonary vascularity normal. No adenopathy. No bone lesions. IMPRESSION: Cardiomegaly.  No edema or consolidation. Electronically  Signed   By: Lowella Grip III M.D.   On: 02/21/2018 08:33   US Pelvic Complete With Transvaginal  Result Date: 02/21/2018 CLINICAL DATA:  Dysfunctional uterine bleeding EXAM: TRANSABDOMINAL AND TRANSVAGINAL ULTRASOUND OF PELVIS TECHNIQUE: Both transabdominal and transvaginal ultrasound examinations of the pelvis were performed. Transabdominal technique was performed for global imaging of the pelvis including uterus, ovaries, adnexal regions, and pelvic cul-de-sac. It was necessary to proceed with endovaginal exam following the transabdominal exam to visualize the endometrium and ovaries. COMPARISON:  None FINDINGS: Uterus Measurements: 13 x 7.3 x 10.5 cm = volume: 520.5 mL. No fibroids or other mass visualized. Endometrium Thickness: 16.1 mm.  No focal abnormality visualized. Right ovary Measurements: 3.9 x 2.4 x 2.7 cm = volume: 12.9 mL. Normal appearance/no adnexal mass. Left ovary Measurements: 6.0 x 3.0 x 4.2 cm = volume: 39.5 mL. Contains a complex cystic mass measuring 3.7 x 2.5 x 3.7 cm Other findings A small amount of physiologic fluid is seen in the pelvis. IMPRESSION: 1. There is a complex cystic mass in the left ovary, probably a corpus luteum cyst or hemorrhagic cyst. A small neoplasm is not excluded on this single study. Recommend a follow-up ultrasound in 6-12 weeks to ensure resolution. 2. The endometrial stripe thickness is 16.1 mm which is abnormal in a patient of this age. If bleeding remains unresponsive to hormonal or medical therapy, focal lesion work-up with sonohysterogram should be considered. Endometrial biopsy should also be considered in pre-menopausal patients at high risk for endometrial carcinoma. (Ref: Radiological Reasoning: Algorithmic Workup of Abnormal Vaginal Bleeding with Endovaginal Sonography and Sonohysterography. AJR 2008; 505:L97-67) Electronically Signed   By: Dorise Bullion III M.D   On: 02/21/2018 14:47     Labs:   Basic Metabolic Panel: Recent Labs  Lab  02/21/18 0746 02/22/18 0541  NA 137 137  K 3.8 3.8  CL 105 106  CO2 20* 22  GLUCOSE 139* 131*  BUN 10 8  CREATININE 0.74 0.73  CALCIUM 8.9 8.4*   GFR Estimated Creatinine Clearance: 125.4 mL/min (by C-G formula based on SCr of 0.73 mg/dL). Liver Function Tests: Recent Labs  Lab 02/21/18 0746  AST 17  ALT 15  ALKPHOS 77  BILITOT 0.3  PROT 8.2*  ALBUMIN 3.5   Recent Labs  Lab 02/21/18 0746  LIPASE 30   No results for input(s): AMMONIA in the last 168 hours. Coagulation profile No results for input(s): INR, PROTIME in the last 168 hours.  CBC: Recent Labs  Lab 02/21/18 0746 02/21/18 1922 02/22/18 0541 02/22/18 1046  WBC 8.2 10.1 9.4  --   NEUTROABS 6.2  --   --   --   HGB 5.9* 7.3* 7.8* 8.3*  HCT 23.0* 26.2* 28.3* 29.0*  MCV 60.1* 62.5* 67.1*  --   PLT 307 287 287  --    Cardiac Enzymes: No results for input(s): CKTOTAL, CKMB, CKMBINDEX, TROPONINI in the last 168 hours. BNP: Invalid input(s): POCBNP CBG: Recent Labs  Lab  02/21/18 1150 02/21/18 1645 02/21/18 2117 02/22/18 0651 02/22/18 1122  GLUCAP 100* 141* 133* 120* 121*   D-Dimer No results for input(s): DDIMER in the last 72 hours. Hgb A1c Recent Labs    02/21/18 0746  HGBA1C 6.2*   Lipid Profile No results for input(s): CHOL, HDL, LDLCALC, TRIG, CHOLHDL, LDLDIRECT in the last 72 hours. Thyroid function studies No results for input(s): TSH, T4TOTAL, T3FREE, THYROIDAB in the last 72 hours.  Invalid input(s): FREET3 Anemia work up No results for input(s): VITAMINB12, FOLATE, FERRITIN, TIBC, IRON, RETICCTPCT in the last 72 hours. Microbiology No results found for this or any previous visit (from the past 240 hour(s)).   Discharge Instructions:   Discharge Instructions    Diet - low sodium heart healthy   Complete by:  As directed    Discharge instructions   Complete by:  As directed    Megace 40 mg BID when she starts her period.   Increase activity slowly   Complete by:  As  directed      Allergies as of 02/22/2018      Reactions   Percocet [oxycodone-acetaminophen] Hives   Latex Rash      Medication List    STOP taking these medications   ibuprofen 800 MG tablet Commonly known as:  ADVIL,MOTRIN   metFORMIN 500 MG tablet Commonly known as:  GLUCOPHAGE     TAKE these medications   HUMALOG KWIKPEN 100 UNIT/ML KwikPen Generic drug:  insulin lispro Inject 12 Units into the skin 3 (three) times daily.   IRON PO Take 36 mg by mouth daily.   megestrol 40 MG tablet Commonly known as:  MEGACE Take 1 tablet (40 mg total) by mouth 2 (two) times daily.   OZEMPIC (0.25 OR 0.5 MG/DOSE) 2 MG/1.5ML Sopn Generic drug:  Semaglutide(0.25 or 0.5MG /DOS) Inject 0.25 mg into the skin once a week. Thursday      Follow-up Information    Lin Landsman, MD Follow up in 1 week(s).   Specialty:  Family Medicine Contact information: [place] [address] [phone]            Time coordinating discharge: 25 min  Signed:  Geradine Girt DO  [facility] 02/22/2018, 12:20 PM

## 2018-03-11 ENCOUNTER — Ambulatory Visit: Payer: Medicaid Other | Admitting: Obstetrics & Gynecology

## 2018-03-12 ENCOUNTER — Telehealth: Payer: Self-pay | Admitting: Obstetrics & Gynecology

## 2018-03-12 NOTE — Telephone Encounter (Signed)
Called in to inform she is not covered with insurance. Stated she is currently applying for charity care. Stated she would like to be keep her appointment. Educated the patient she would have to pay a 20.00 co-pay and the remaining would be billed.

## 2018-03-14 ENCOUNTER — Ambulatory Visit (INDEPENDENT_AMBULATORY_CARE_PROVIDER_SITE_OTHER): Payer: Self-pay | Admitting: Obstetrics & Gynecology

## 2018-03-14 ENCOUNTER — Other Ambulatory Visit (HOSPITAL_COMMUNITY)
Admission: RE | Admit: 2018-03-14 | Discharge: 2018-03-14 | Disposition: A | Discharge status: Home / Self Care | Payer: Medicaid Other | Source: Ambulatory Visit | Attending: Obstetrics & Gynecology | Admitting: Obstetrics & Gynecology

## 2018-03-14 VITALS — BP 135/78 | HR 83 | Wt 241.9 lb

## 2018-03-14 DIAGNOSIS — R9389 Abnormal findings on diagnostic imaging of other specified body structures: Secondary | ICD-10-CM | POA: Insufficient documentation

## 2018-03-14 DIAGNOSIS — N920 Excessive and frequent menstruation with regular cycle: Secondary | ICD-10-CM

## 2018-03-14 DIAGNOSIS — D6489 Other specified anemias: Secondary | ICD-10-CM

## 2018-03-14 MED ORDER — NORGESTREL-ETHINYL ESTRADIOL 0.3-30 MG-MCG PO TABS: 1.0000 | ORAL_TABLET | Freq: Every day | ORAL | 11 refills | Status: DC

## 2018-03-14 NOTE — Progress Notes (Signed)
   Subjective:    Patient ID: Tamara Green, female    DOB: [birth year], 38 y.o.   MRN: [id number]  HPI 38 yo married P4 here for followup after a recent admission for transfusion due to symptomatic anemia which was due to menorrhagia. Her most recent HBG was 8.3 on 02/22/18.  She would like to avoid surgery if possible.  Review of Systems She had 3 vaginal deliveries and then 1 cesarean section. I did a CKC and BTL on her last year.   She had some omental adhesions noted on my op note in 2019.  Objective:   Physical Exam Breathing, conversing, and ambulating normally Well nourished, well hydrated Black female, no apparent distress Abd- benign Cervix- well healed s/p CKC Moderate uterine prolapse, good pubic arch  UPT negative, consent signed, time out done Cervix prepped with betadine and grasped with a single tooth tenaculum Uterus sounded to 9 cm Pipelle used for 2 passes with a large amount of tissue obtained. She tolerated the procedure well.  U/s showed a 13 x 7x 10 cm uterus with no fibroids and a 16 mm thick endoetrium    Assessment & Plan:  Menorrhagia requiring transfusion She only took the megace briefly because she did not like the way that it made her feel.  She would like to avoid surgery if possible. She will start OCPs with her NMP and come in soon after for a BP check. Come see me in 4 months/prn sooner Await pathology from [facility].

## 2018-04-03 ENCOUNTER — Ambulatory Visit: Payer: Medicaid Other

## 2018-04-04 ENCOUNTER — Encounter: Payer: Self-pay | Admitting: *Deleted

## 2018-06-14 ENCOUNTER — Emergency Department (HOSPITAL_COMMUNITY)
Admission: EM | Admit: 2018-06-14 | Discharge: 2018-06-14 | Disposition: A | Discharge status: Home / Self Care | Payer: HRSA Program | Attending: Emergency Medicine | Admitting: Emergency Medicine

## 2018-06-14 ENCOUNTER — Other Ambulatory Visit: Payer: Self-pay

## 2018-06-14 ENCOUNTER — Emergency Department (HOSPITAL_COMMUNITY): Payer: HRSA Program

## 2018-06-14 ENCOUNTER — Encounter (HOSPITAL_COMMUNITY): Payer: Self-pay

## 2018-06-14 DIAGNOSIS — Z91040 Latex allergy status: Secondary | ICD-10-CM | POA: Insufficient documentation

## 2018-06-14 DIAGNOSIS — R002 Palpitations: Secondary | ICD-10-CM | POA: Insufficient documentation

## 2018-06-14 DIAGNOSIS — R42 Dizziness and giddiness: Secondary | ICD-10-CM | POA: Diagnosis not present

## 2018-06-14 DIAGNOSIS — D649 Anemia, unspecified: Secondary | ICD-10-CM | POA: Diagnosis not present

## 2018-06-14 DIAGNOSIS — R5383 Other fatigue: Secondary | ICD-10-CM

## 2018-06-14 DIAGNOSIS — R0602 Shortness of breath: Secondary | ICD-10-CM | POA: Insufficient documentation

## 2018-06-14 DIAGNOSIS — Z793 Long term (current) use of hormonal contraceptives: Secondary | ICD-10-CM | POA: Diagnosis not present

## 2018-06-14 DIAGNOSIS — E119 Type 2 diabetes mellitus without complications: Secondary | ICD-10-CM | POA: Diagnosis not present

## 2018-06-14 DIAGNOSIS — R531 Weakness: Secondary | ICD-10-CM | POA: Insufficient documentation

## 2018-06-14 DIAGNOSIS — Z794 Long term (current) use of insulin: Secondary | ICD-10-CM | POA: Insufficient documentation

## 2018-06-14 DIAGNOSIS — Z87891 Personal history of nicotine dependence: Secondary | ICD-10-CM | POA: Insufficient documentation

## 2018-06-14 DIAGNOSIS — U071 COVID-19: Secondary | ICD-10-CM | POA: Diagnosis not present

## 2018-06-14 DIAGNOSIS — B349 Viral infection, unspecified: Secondary | ICD-10-CM | POA: Diagnosis not present

## 2018-06-14 LAB — POCT I-STAT EG7
Bicarbonate: 22.7 mmol/L (ref 20.0–28.0)
Calcium, Ion: 1.13 mmol/L — ABNORMAL LOW (ref 1.15–1.40)
HCT: 35 % — ABNORMAL LOW (ref 36.0–46.0)
Hemoglobin: 11.9 g/dL — ABNORMAL LOW (ref 12.0–15.0)
O2 Saturation: 99 %
Potassium: 4.3 mmol/L (ref 3.5–5.1)
Sodium: 137 mmol/L (ref 135–145)
TCO2: 24 mmol/L (ref 22–32)
pCO2, Ven: 30.9 mmHg — ABNORMAL LOW (ref 44.0–60.0)
pH, Ven: 7.474 — ABNORMAL HIGH (ref 7.250–7.430)
pO2, Ven: 120 mmHg — ABNORMAL HIGH (ref 32.0–45.0)

## 2018-06-14 LAB — CBC WITH DIFFERENTIAL/PLATELET
Abs Immature Granulocytes: 0.08 10*3/uL — ABNORMAL HIGH (ref 0.00–0.07)
Basophils Absolute: 0 10*3/uL (ref 0.0–0.1)
Basophils Relative: 0 %
Eosinophils Absolute: 0.1 10*3/uL (ref 0.0–0.5)
Eosinophils Relative: 1 %
HCT: 34.5 % — ABNORMAL LOW (ref 36.0–46.0)
Hemoglobin: 11.5 g/dL — ABNORMAL LOW (ref 12.0–15.0)
Immature Granulocytes: 1 %
Lymphocytes Relative: 28 %
Lymphs Abs: 2.2 10*3/uL (ref 0.7–4.0)
MCH: 27 pg (ref 26.0–34.0)
MCHC: 33.3 g/dL (ref 30.0–36.0)
MCV: 81 fL (ref 80.0–100.0)
Monocytes Absolute: 0.7 10*3/uL (ref 0.1–1.0)
Monocytes Relative: 9 %
Neutro Abs: 4.7 10*3/uL (ref 1.7–7.7)
Neutrophils Relative %: 61 %
Platelets: 555 10*3/uL — ABNORMAL HIGH (ref 150–400)
RBC: 4.26 MIL/uL (ref 3.87–5.11)
RDW: 13.2 % (ref 11.5–15.5)
WBC: 7.8 10*3/uL (ref 4.0–10.5)
nRBC: 0 % (ref 0.0–0.2)

## 2018-06-14 LAB — BASIC METABOLIC PANEL
Anion gap: 10 (ref 5–15)
BUN: 9 mg/dL (ref 6–20)
CO2: 21 mmol/L — ABNORMAL LOW (ref 22–32)
Calcium: 9 mg/dL (ref 8.9–10.3)
Chloride: 104 mmol/L (ref 98–111)
Creatinine, Ser: 0.67 mg/dL (ref 0.44–1.00)
GFR calc Af Amer: >60 mL/min (ref >60)
GFR calc non Af Amer: >60 mL/min (ref >60)
Glucose, Bld: 265 mg/dL — ABNORMAL HIGH (ref 70–99)
Potassium: 4.2 mmol/L (ref 3.5–5.1)
Sodium: 135 mmol/L (ref 135–145)

## 2018-06-14 LAB — TYPE AND SCREEN
ABO/RH(D): O NEG
Antibody Screen: NEGATIVE

## 2018-06-14 LAB — CBG MONITORING, ED: Glucose-Capillary: 249 mg/dL — ABNORMAL HIGH (ref 70–99)

## 2018-06-14 LAB — I-STAT BETA HCG BLOOD, ED (MC, WL, AP ONLY): I-stat hCG, quantitative: <5 m[IU]/mL (ref <5)

## 2018-06-14 LAB — TSH: TSH: 0.402 u[IU]/mL (ref 0.350–4.500)

## 2018-06-14 MED ORDER — SODIUM CHLORIDE 0.9 % IV BOLUS
500.0000 mL | Freq: Once | INTRAVENOUS | Status: AC
  Administered 2018-06-14: 500 mL via INTRAVENOUS

## 2018-06-14 MED ORDER — SODIUM CHLORIDE 0.9 % IV BOLUS
1000.0000 mL | Freq: Once | INTRAVENOUS | Status: AC
  Administered 2018-06-14: 16:00:00 1000 mL via INTRAVENOUS

## 2018-06-14 MED ORDER — ONDANSETRON HCL 4 MG/2ML IJ SOLN
4.0000 mg | Freq: Once | INTRAMUSCULAR | Status: AC
  Administered 2018-06-14: 4 mg via INTRAVENOUS
  Filled 2018-06-14: qty 2

## 2018-06-14 MED ORDER — BENZONATATE 100 MG PO CAPS: 100.0000 mg | ORAL_CAPSULE | Freq: Three times a day (TID) | ORAL | 0 refills | Status: DC | PRN

## 2018-06-14 NOTE — ED Triage Notes (Addendum)
Pt from home; c/o weakness and dizziness for several weeks; pt states she has recently recovered from flu, was not tested for covid; states she has "bleeding issues, I'm always bleeding [vaginally]"; denies bleeding currently; hx anemia, blood transfusions; denies sick contacts; pt states she's on medications to help stop the bleeding "but it just makes it worse"; pt tearful; denies pain

## 2018-06-14 NOTE — ED Provider Notes (Signed)
[facility] EMERGENCY DEPARTMENT Provider Note   CSN: [id number] Arrival date & time: 06/14/18  1418    History   Chief Complaint Chief Complaint  Patient presents with  . Dizziness  . Fatigue    HPI Tamara Green is a 38 y.o. female with history of insulin-dependent diabetes mellitus type 2, dysfunctional uterine bleeding, anemia presents for evaluation of acute onset, progressively worsening generalized weakness for several weeks.  Reports that 3 weeks ago she began to experience flulike symptoms consisting of generalized myalgias, nausea vomiting.  Mild cough and does note some dyspnea on exertion and palpitations.  She reports that she has been experiencing vaginal bleeding "forever "and is currently being managed by her OB/GYN with megace and OCPs. She also takes iron supplements. She reports her vaginal bleeding worsened a bit while she was taking Goody's powder for management of her pain and fever.  She also reports that her blood sugars have been elevated compared to baseline.  She states her baseline is usually around 200 but her sugars have been running in the 300s while she was experiencing her flulike symptoms.  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  She has been taking over-the-counter medications with little relief.  She reports the last time she felt similarly was when she required a blood transfusion.     The history is provided by the patient.    Past Medical History:  Diagnosis Date  . Anemia    no current med.  . Diabetes mellitus type 2 in obese ([place])   . Heartburn    no current med.  Marland Kitchen History of blood transfusion 01/2014  . Rash of vulva 09/02/2017  . Rheumatoid arthritis(714.0)    no current med.  . Sensitive skin     Patient Active Problem List   Diagnosis Date Noted  . Dysfunctional uterine bleeding 02/21/2018  . 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 HGSIL  (high grade squamous intraepithelial lesion) on Pap smear of cervix 07/09/2017  . S/P cesarean section 12/11/2014  . Symptomatic anemia 02/03/2014  . Rheumatoid arthritis ([place]) 02/03/2014  . Anemia 02/03/2014    Past Surgical History:  Procedure Laterality Date  . BREAST BIOPSY Right   . CERVICAL CERCLAGE  07/10/2000  . CERVICAL CERCLAGE N/A 09/17/2014   Procedure: CERCLAGE CERVICAL;  Surgeon: Shelly Bombard, MD;  Location: [facility];  Service: Gynecology;  Laterality: N/A;  . CERVICAL CERCLAGE  10/30/2001  . CERVICAL CERCLAGE  05/02/2006  . CERVICAL CONIZATION W/BX N/A 09/10/2017   Procedure: COLD KNIFE CONIZATION;  Surgeon: Emily Filbert, MD;  Location: [facility];  Service: Gynecology;  Laterality: N/A;  . CESAREAN SECTION N/A 12/11/2014   Procedure: CESAREAN SECTION;  Surgeon: Frederico Hamman, MD;  Location: [facility];  Service: Obstetrics;  Laterality: N/A;  . LAPAROSCOPIC TUBAL LIGATION Bilateral 09/10/2017   Procedure: LAPAROSCOPIC TUBAL LIGATION - FILSHIE CLIPS;  Surgeon: Emily Filbert, MD;  Location: [facility];  Service: Gynecology;  Laterality: Bilateral;     OB History    Gravida  6   Para  4   Term  2   Preterm  2   AB  2   Living  4     SAB  2   TAB      Ectopic      Multiple  0   Live Births  4  Home Medications    Prior to Admission medications   Medication Sig Start Date End Date Taking? Authorizing Provider  benzonatate (TESSALON) 100 MG capsule Take 1 capsule (100 mg total) by mouth 3 (three) times daily as needed for cough. 06/14/18   [place] Kestner A, PA-C  Ferrous Sulfate (IRON PO) Take 36 mg by mouth daily.    [provider]  HUMALOG KWIKPEN 100 UNIT/ML KwikPen Inject 12 Units into the skin 3 (three) times daily.  01/23/18   [provider]  megestrol (MEGACE) 40 MG tablet Take 1 tablet (40 mg total) by mouth 2 (two) times daily. Patient not taking: Reported on 03/14/2018 02/21/18   Emily Filbert, MD  norgestrel-ethinyl estradiol (LO/OVRAL,CRYSELLE) 0.3-30 MG-MCG tablet Take 1 tablet by mouth daily. 03/14/18   Emily Filbert, MD  Semaglutide,0.25 or 0.5MG /DOS, (OZEMPIC, 0.25 OR 0.5 MG/DOSE,) 2 MG/1.5ML SOPN Inject 0.25 mg into the skin once a week. Thursday    [provider]    Family History Family History  Problem Relation Age of Onset  . Diabetes Mother     Social History Social History   Tobacco Use  . Smoking status: Former Smoker    Last attempt to quit: 01/09/2012    Years since quitting: 6.4  . Smokeless tobacco: Never Used  Substance Use Topics  . Alcohol use: Yes    Comment: occasionally  . Drug use: No     Allergies   Percocet [oxycodone-acetaminophen] and Latex   Review of Systems Review of Systems  Constitutional: Positive for fatigue.  Respiratory: Positive for shortness of breath (dyspnea on exertion).   Cardiovascular: Positive for palpitations. Negative for chest pain.  Gastrointestinal: Positive for nausea and vomiting. Negative for abdominal pain.  Genitourinary: Positive for vaginal bleeding (chronic).  Neurological: Positive for weakness (generalized) and light-headedness.  All other systems reviewed and are negative.    Physical Exam Updated Vital Signs BP (!) 137/94   Pulse 73   Temp 99 F (37.2 C)   Resp 18   Ht 5\' 8"  (1.727 m)   Wt 104.3 kg   SpO2 97%   BMI 34.97 kg/m   Physical Exam Vitals signs and nursing note reviewed.  Constitutional:      General: She is not in acute distress.    Appearance: She is well-developed.     Comments: Appears fatigued  HENT:     Head: Normocephalic and atraumatic.  Eyes:     General:        Right eye: No discharge.        Left eye: No discharge.     Conjunctiva/sclera: Conjunctivae normal.  Neck:     Vascular: No JVD.     Trachea: No tracheal deviation.  Cardiovascular:     Rate and Rhythm: Normal rate and regular rhythm.     Pulses: Normal pulses.     Heart sounds: Normal  heart sounds.  Pulmonary:     Effort: Pulmonary effort is normal.     Breath sounds: Normal breath sounds.  Abdominal:     General: Abdomen is flat. There is no distension.     Palpations: Abdomen is soft.     Tenderness: There is no abdominal tenderness. There is no guarding or rebound.  Skin:    General: Skin is warm and dry.     Coloration: Skin is pale.     Findings: No erythema.  Neurological:     General: No focal deficit present.  Mental Status: She is alert.     Comments: Fluent speech, no facial droop.  Cranial nerves appear grossly intact.  Moving extremities spontaneously with seemingly intact strength.  Psychiatric:        Behavior: Behavior normal.      ED Treatments / Results  Labs (all labs ordered are listed, but only abnormal results are displayed) Labs Reviewed  BASIC METABOLIC PANEL - Abnormal; Notable for the following components:      Result Value   CO2 21 (*)    Glucose, Bld 265 (*)    All other components within normal limits  CBC WITH DIFFERENTIAL/PLATELET - Abnormal; Notable for the following components:   Hemoglobin 11.5 (*)    HCT 34.5 (*)    Platelets 555 (*)    Abs Immature Granulocytes 0.08 (*)    All other components within normal limits  CBG MONITORING, ED - Abnormal; Notable for the following components:   Glucose-Capillary 249 (*)    All other components within normal limits  POCT I-STAT EG7 - Abnormal; Notable for the following components:   pH, Ven 7.474 (*)    pCO2, Ven 30.9 (*)    pO2, Ven 120.0 (*)    Calcium, Ion 1.13 (*)    HCT 35.0 (*)    Hemoglobin 11.9 (*)    All other components within normal limits  NOVEL CORONAVIRUS, NAA (HOSPITAL ORDER, SEND-OUT TO REF LAB)  TSH  I-STAT BETA HCG BLOOD, ED ([facility], [facility], [facility] ONLY)  TYPE AND SCREEN    EKG EKG Interpretation  Date/Time:  Saturday June 14 2018 14:28:29 EDT Ventricular Rate:  88 PR Interval:    QRS Duration: 87 QT Interval:  392 QTC Calculation: 475 R Axis:   66  Text Interpretation:  Sinus rhythm Nonspecific T abnormalities, diffuse leads new t wave inversion laterally Confirmed by Lennice Sites [id number]) on 06/14/2018 2:33:03 PM   Radiology Dg Chest Portable 1 View  Result Date: 06/14/2018 CLINICAL DATA:  Weakness and dizziness EXAM: PORTABLE CHEST 1 VIEW COMPARISON:  February 21, 2018 FINDINGS: There is mild atelectatic change in the bases. There is no evident edema or consolidation. Heart is enlarged, stable, with pulmonary vascularity normal. No adenopathy. No bone lesions. IMPRESSION: Stable cardiomegaly. Mild bibasilar atelectasis. No frank edema or consolidation. Electronically Signed   By: Lowella Grip III M.D.   On: 06/14/2018 16:57    Procedures Procedures (including critical care time)  Medications Ordered in ED Medications  ondansetron (ZOFRAN) injection 4 mg (4 mg Intravenous Given 06/14/18 1504)  sodium chloride 0.9 % bolus 500 mL (0 mLs Intravenous Stopped 06/14/18 1559)  sodium chloride 0.9 % bolus 1,000 mL (0 mLs Intravenous Stopped 06/14/18 1742)     Initial Impression / Assessment and Plan / ED Course  I have reviewed the triage vital signs and the nursing notes.  Pertinent labs & imaging results that were available during my care of the patient were reviewed by me and considered in my medical decision making (see chart for details).        Patient presenting for evaluation of fatigue and flulike symptoms for around 3 weeks.  She is afebrile, vital signs are stable in the ED.  She is nontoxic but fatigued in appearance.  No focal neurologic deficits noted.  She is anemic with hemoglobin 11.5 but this is better than her baseline of around 8 based on chart review, and thus she does not require blood transfusion. Remainder of labs reviewed by me show no leukocytosis, no  metabolic derangements.  She is hyperglycemic but no evidence of DKA as her anion gap is within normal limits and she is not acidotic on VBG.  EKG shows nonspecific T  wave inversions in the lateral leads however she has no complaint of chest pain and is low risk for cardiac disease.  I doubt ACS/MI.  Chest x-ray shows no acute cardiopulmonary abnormalities.  Doubt PE.  She is not orthostatic. She was ambulated in the ED with stable SPO2 saturations. Reports she is feeling a bit better after zofran and fluids. I suspect she is experiencing some sort of viral process. Given we are in the midst of the COVID-19 pandemic, will obtain sendout test. She does not meet criteria for admission which I explained to her and which she understands. Discussed symptomatic management and home quarantine until her results return. Also recommend close follow up with her PCP for re-evaluation of symptoms; offered to obtain TSH for evaluation of fatigue for her PCP to follow up on an outpatient basis. Discussed strict ED return precautions. Patient verbalized understanding of and agreement with plan and is safe for discharge home at this time.   Tamara Green was evaluated in Emergency Department on 06/15/2018 for the symptoms described in the history of present illness. She was evaluated in the context of the global COVID-19 pandemic, which necessitated consideration that the patient might be at risk for infection with the SARS-CoV-2 virus that causes COVID-19. Institutional protocols and algorithms that pertain to the evaluation of patients at risk for COVID-19 are in a state of rapid change based on information released by regulatory bodies including the CDC and federal and state organizations. These policies and algorithms were followed during the patient's care in the ED.  Final Clinical Impressions(s) / ED Diagnoses   Final diagnoses:  Generalized weakness  Fatigue, unspecified type  Viral illness    ED Discharge Orders         Ordered    benzonatate (TESSALON) 100 MG capsule  3 times daily PRN     06/14/18 1712           Renita Papa, PA-C 06/15/18 0751     Lennice Sites, DO 06/15/18 1103

## 2018-06-14 NOTE — Discharge Instructions (Signed)
Your chest x-ray was reassuring.  No signs of pneumonia on your chest x-ray.  The remainder of your blood work was also reassuring.  You can alternate ibuprofen and Tylenol every 3-6 hours as needed for aches pains or fevers.  Drink plenty of water and get plenty of rest.  You can also take over-the-counter medicines for your other symptoms such as Mucinex, Robitussin, NyQuil, DayQuil, Sudafed.  If your cough is bothering you, you can take Tessalon.  Your COVID test is pending, will result within 48 hours or so.  You can check your results on my chart.  If you do not have my chart set up, you can follow the instructions in this packet.  Continue to quarantine at home until your results have returned.  Call your primary care physician and set up a follow-up appointment for reevaluation within the next 2 to 4 days.  Return to the emergency department immediately if any concerning signs or symptoms develop such as persistently high fevers, persistent vomiting, worsening shortness of breath or chest pains.

## 2018-06-14 NOTE — ED Notes (Signed)
Pt pulse Ox remained >92% while ambulating

## 2018-06-16 LAB — NOVEL CORONAVIRUS, NAA (HOSP ORDER, SEND-OUT TO REF LAB; TAT 18-24 HRS): SARS-CoV-2, NAA: DETECTED — AB

## 2018-07-23 ENCOUNTER — Telehealth (INDEPENDENT_AMBULATORY_CARE_PROVIDER_SITE_OTHER): Payer: Self-pay | Admitting: Obstetrics & Gynecology

## 2018-07-23 ENCOUNTER — Other Ambulatory Visit: Payer: Self-pay

## 2018-07-23 DIAGNOSIS — N920 Excessive and frequent menstruation with regular cycle: Secondary | ICD-10-CM

## 2018-07-23 NOTE — Progress Notes (Signed)
TELEHEALTH GYNECOLOGY VIRTUAL VIDEO VISIT ENCOUNTER NOTE  Provider location: [facility] at [facility]   I connected with [name] on 07/23/18 at  4:15 PM EDT by MyChart Video Encounter at home and verified that I am speaking with the correct person using two identifiers.   I discussed the limitations, risks, security and privacy concerns of performing an evaluation and management service virtually and the availability of in person appointments. I also discussed with the patient that there may be a patient responsible charge related to this service. The patient expressed understanding and agreed to proceed.   History:  Tamara Green is a married 38 y.o. [phone] female being evaluated today for  heavy periods. She is taking OCPs and added megace 20 mg daily. She added the megace because the bleeding was still heavy.  She is still bleeding daily but it is light.   Past Medical History:  Diagnosis Date  . Anemia    no current med.  . Diabetes mellitus type 2 in obese ([place])   . Heartburn    no current med.  Marland Kitchen History of blood transfusion 01/2014  . Rash of vulva 09/02/2017  . Rheumatoid arthritis(714.0)    no current med.  . Sensitive skin    Past Surgical History:  Procedure Laterality Date  . BREAST BIOPSY Right   . CERVICAL CERCLAGE  07/10/2000  . CERVICAL CERCLAGE N/A 09/17/2014   Procedure: CERCLAGE CERVICAL;  Surgeon: Shelly Bombard, MD;  Location: [facility];  Service: Gynecology;  Laterality: N/A;  . CERVICAL CERCLAGE  10/30/2001  . CERVICAL CERCLAGE  05/02/2006  . CERVICAL CONIZATION W/BX N/A 09/10/2017   Procedure: COLD KNIFE CONIZATION;  Surgeon: Emily Filbert, MD;  Location: [place];  Service: Gynecology;  Laterality: N/A;  . CESAREAN SECTION N/A 12/11/2014   Procedure: CESAREAN SECTION;  Surgeon: Frederico Hamman, MD;  Location: [facility];  Service: Obstetrics;  Laterality: N/A;  . LAPAROSCOPIC TUBAL LIGATION  Bilateral 09/10/2017   Procedure: LAPAROSCOPIC TUBAL LIGATION - FILSHIE CLIPS;  Surgeon: Emily Filbert, MD;  Location: [place];  Service: Gynecology;  Laterality: Bilateral;   The following portions of the patient's history were reviewed and updated as appropriate: allergies, current medications, past family history, past medical history, past social history, past surgical history and problem list.     Review of Systems:  Pertinent items noted in HPI and remainder of comprehensive ROS otherwise negative.  Physical Exam:   General:  Alert, oriented and cooperative. Patient appears to be in no acute distress.  Mental Status: Normal mood and affect. Normal behavior. Normal judgment and thought content.   Respiratory: Normal respiratory effort, no problems with respiration noted  Rest of physical exam deferred due to type of encounter  Labs and Imaging No results found for this or any previous visit (from the past 336 hour(s)). No results found.     Assessment and Plan:     Menorrhagia- she has tried medical options with no help She is ready to move forward with a hysterectomy, tired of bleeding I will send a message to [place] to schedule LAVH/BS     She reports that her sugars are generally 150-250. I have told her that healing will be impaired with elevated sugars. She will need to improve her glucose control prior to surgery. She will call her primary care Dr. Loma Sousa and work on this asap. In the meantime, stay on OCPs and megace.  I discussed the assessment and treatment plan with the patient. The patient was provided an opportunity to ask questions and all were answered. The patient agreed with the plan and demonstrated an understanding of the instructions.   The patient was advised to call back or seek an in-person evaluation/go to the ED if the symptoms worsen or if the condition fails to improve as anticipated.  I provided 10 minutes of face-to-face time during  this encounter.   Emily Filbert, MD [facility], [place]

## 2018-09-25 ENCOUNTER — Telehealth: Payer: Self-pay

## 2018-09-25 NOTE — Telephone Encounter (Signed)
Called pt to advise that we need for her to come in & sign The Hysterectomy Statement Form before scheduled Hysterectomy, no answer, left VM to come in asap, forms will be at front desk.

## 2018-09-25 NOTE — Telephone Encounter (Signed)
-----   Message from Francia Greaves sent at 09/25/2018 10:55 AM EDT ----- Regarding: Needs Hysterectomy Statement Needs Hysterectomy Statement, surgery 10/27

## 2018-11-19 ENCOUNTER — Telehealth: Payer: Self-pay

## 2018-11-19 NOTE — Telephone Encounter (Signed)
-----   Message from Francia Greaves sent at 11/17/2018 12:01 PM EST ----- Regarding: Needs Hysterectomy Statement Needs hysterectomy statement, surgery 12/8

## 2018-11-19 NOTE — Telephone Encounter (Signed)
Called pt to advise that we are needing Hysterectomy Statement signed asap. Pt stated will try and come tomorrow before office closes.

## 2018-11-26 ENCOUNTER — Other Ambulatory Visit: Payer: Self-pay | Admitting: Obstetrics & Gynecology

## 2018-11-26 ENCOUNTER — Telehealth: Payer: Self-pay

## 2018-11-26 ENCOUNTER — Other Ambulatory Visit: Payer: Self-pay

## 2018-11-26 DIAGNOSIS — Z09 Encounter for follow-up examination after completed treatment for conditions other than malignant neoplasm: Secondary | ICD-10-CM

## 2018-11-26 DIAGNOSIS — N83299 Other ovarian cyst, unspecified side: Secondary | ICD-10-CM

## 2018-11-26 NOTE — Telephone Encounter (Signed)
Called pt to advise U/S appointment sccheduled on 12/03/18 at 11am, asked if she could arrive at 10:45a w/full bladder.Pt verbalized understanding.

## 2018-11-26 NOTE — Progress Notes (Signed)
She needs a follow up u/s to follow the ovarian cyst seen on u/s 2/20 (prior to her surgery on 128-20)

## 2018-11-27 ENCOUNTER — Encounter: Payer: Self-pay | Admitting: *Deleted

## 2018-12-03 ENCOUNTER — Ambulatory Visit (HOSPITAL_COMMUNITY)
Admission: RE | Admit: 2018-12-03 | Discharge: 2018-12-03 | Disposition: A | Discharge status: Home / Self Care | Payer: Self-pay | Source: Ambulatory Visit | Attending: Obstetrics & Gynecology | Admitting: Obstetrics & Gynecology

## 2018-12-03 ENCOUNTER — Other Ambulatory Visit: Payer: Self-pay

## 2018-12-03 DIAGNOSIS — Z09 Encounter for follow-up examination after completed treatment for conditions other than malignant neoplasm: Secondary | ICD-10-CM | POA: Insufficient documentation

## 2018-12-10 ENCOUNTER — Encounter (HOSPITAL_BASED_OUTPATIENT_CLINIC_OR_DEPARTMENT_OTHER): Payer: Self-pay | Admitting: *Deleted

## 2018-12-10 ENCOUNTER — Other Ambulatory Visit: Payer: Self-pay

## 2018-12-12 ENCOUNTER — Other Ambulatory Visit (HOSPITAL_COMMUNITY)
Admission: RE | Admit: 2018-12-12 | Discharge: 2018-12-12 | Disposition: A | Discharge status: Home / Self Care | Payer: Medicaid Other | Source: Ambulatory Visit | Attending: Obstetrics & Gynecology | Admitting: Obstetrics & Gynecology

## 2018-12-12 DIAGNOSIS — Z20828 Contact with and (suspected) exposure to other viral communicable diseases: Secondary | ICD-10-CM | POA: Diagnosis not present

## 2018-12-12 DIAGNOSIS — Z01812 Encounter for preprocedural laboratory examination: Secondary | ICD-10-CM | POA: Insufficient documentation

## 2018-12-13 LAB — NOVEL CORONAVIRUS, NAA (HOSP ORDER, SEND-OUT TO REF LAB; TAT 18-24 HRS): SARS-CoV-2, NAA: NOT DETECTED

## 2018-12-15 ENCOUNTER — Encounter (HOSPITAL_BASED_OUTPATIENT_CLINIC_OR_DEPARTMENT_OTHER)
Admission: RE | Admit: 2018-12-15 | Discharge: 2018-12-15 | Disposition: A | Discharge status: Home / Self Care | Payer: Medicaid Other | Source: Ambulatory Visit | Attending: Obstetrics & Gynecology | Admitting: Obstetrics & Gynecology

## 2018-12-15 ENCOUNTER — Other Ambulatory Visit: Payer: Self-pay

## 2018-12-15 DIAGNOSIS — Z01812 Encounter for preprocedural laboratory examination: Secondary | ICD-10-CM | POA: Insufficient documentation

## 2018-12-15 LAB — POCT PREGNANCY, URINE: Preg Test, Ur: NEGATIVE

## 2018-12-15 LAB — BASIC METABOLIC PANEL
Anion gap: 8 (ref 5–15)
BUN: 9 mg/dL (ref 6–20)
CO2: 22 mmol/L (ref 22–32)
Calcium: 9.1 mg/dL (ref 8.9–10.3)
Chloride: 107 mmol/L (ref 98–111)
Creatinine, Ser: 0.81 mg/dL (ref 0.44–1.00)
GFR calc Af Amer: >60 mL/min (ref >60)
GFR calc non Af Amer: >60 mL/min (ref >60)
Glucose, Bld: 159 mg/dL — ABNORMAL HIGH (ref 70–99)
Potassium: 3.7 mmol/L (ref 3.5–5.1)
Sodium: 137 mmol/L (ref 135–145)

## 2018-12-15 NOTE — Progress Notes (Signed)

## 2018-12-15 NOTE — Anesthesia Preprocedure Evaluation (Addendum)
Anesthesia Evaluation  Patient identified by MRN, date of birth, ID band Patient awake    Reviewed: Allergy & Precautions, NPO status , Patient's Chart, lab work & pertinent test results  Airway Mallampati: II  TM Distance: >3 FB Neck ROM: Full    Dental no notable dental hx. (+) Teeth Intact   Pulmonary neg pulmonary ROS, former smoker,    Pulmonary exam normal breath sounds clear to auscultation       Cardiovascular Exercise Tolerance: Good negative cardio ROS Normal cardiovascular exam Rhythm:Regular Rate:Normal     Neuro/Psych negative neurological ROS  negative psych ROS   GI/Hepatic negative GI ROS, Neg liver ROS,   Endo/Other  diabetes, Type 2  Renal/GU K+ 3.7 Cr 0.81     Musculoskeletal   Abdominal (+) + obese,   Peds  Hematology Type & Screen O neg   Anesthesia Other Findings   Reproductive/Obstetrics negative OB ROS                          Anesthesia Physical Anesthesia Plan  ASA: III  Anesthesia Plan: General   Post-op Pain Management:    Induction: Intravenous  PONV Risk Score and Plan: 4 or greater and Treatment may vary due to age or medical condition, Midazolam, Scopolamine patch - Pre-op, Ondansetron and Dexamethasone  Airway Management Planned:   Additional Equipment: None  Intra-op Plan:   Post-operative Plan: Extubation in OR  Informed Consent: I have reviewed the patients History and Physical, chart, labs and discussed the procedure including the risks, benefits and alternatives for the proposed anesthesia with the patient or authorized representative who has indicated his/her understanding and acceptance.     Dental advisory given  Plan Discussed with: CRNA  Anesthesia Plan Comments:        Anesthesia Quick Evaluation

## 2018-12-16 ENCOUNTER — Inpatient Hospital Stay (HOSPITAL_COMMUNITY)
Admission: RE | Admit: 2018-12-16 | Discharge: 2018-12-18 | DRG: 742 | Disposition: A | Discharge status: Home / Self Care | Payer: Medicaid Other | Attending: Obstetrics & Gynecology | Admitting: Obstetrics & Gynecology

## 2018-12-16 ENCOUNTER — Ambulatory Visit (HOSPITAL_BASED_OUTPATIENT_CLINIC_OR_DEPARTMENT_OTHER): Payer: Medicaid Other | Admitting: Anesthesiology

## 2018-12-16 ENCOUNTER — Encounter (HOSPITAL_COMMUNITY)
Admission: RE | Disposition: A | Discharge status: Home / Self Care | Payer: Medicaid Other | Source: Home / Self Care | Attending: Obstetrics & Gynecology

## 2018-12-16 ENCOUNTER — Other Ambulatory Visit: Payer: Self-pay

## 2018-12-16 ENCOUNTER — Ambulatory Visit: Admit: 2018-12-16 | Payer: Medicaid Other | Admitting: Obstetrics & Gynecology

## 2018-12-16 ENCOUNTER — Encounter (HOSPITAL_BASED_OUTPATIENT_CLINIC_OR_DEPARTMENT_OTHER): Payer: Self-pay

## 2018-12-16 DIAGNOSIS — Z87891 Personal history of nicotine dependence: Secondary | ICD-10-CM | POA: Diagnosis not present

## 2018-12-16 DIAGNOSIS — Z79899 Other long term (current) drug therapy: Secondary | ICD-10-CM

## 2018-12-16 DIAGNOSIS — Z5331 Laparoscopic surgical procedure converted to open procedure: Secondary | ICD-10-CM

## 2018-12-16 DIAGNOSIS — N941 Unspecified dyspareunia: Secondary | ICD-10-CM | POA: Diagnosis present

## 2018-12-16 DIAGNOSIS — E119 Type 2 diabetes mellitus without complications: Secondary | ICD-10-CM | POA: Diagnosis present

## 2018-12-16 DIAGNOSIS — D259 Leiomyoma of uterus, unspecified: Principal | ICD-10-CM | POA: Diagnosis present

## 2018-12-16 DIAGNOSIS — N938 Other specified abnormal uterine and vaginal bleeding: Secondary | ICD-10-CM | POA: Diagnosis present

## 2018-12-16 DIAGNOSIS — Z885 Allergy status to narcotic agent status: Secondary | ICD-10-CM | POA: Diagnosis not present

## 2018-12-16 DIAGNOSIS — Z793 Long term (current) use of hormonal contraceptives: Secondary | ICD-10-CM

## 2018-12-16 DIAGNOSIS — D62 Acute posthemorrhagic anemia: Secondary | ICD-10-CM | POA: Diagnosis not present

## 2018-12-16 DIAGNOSIS — Z6836 Body mass index (BMI) 36.0-36.9, adult: Secondary | ICD-10-CM

## 2018-12-16 DIAGNOSIS — N870 Mild cervical dysplasia: Secondary | ICD-10-CM | POA: Diagnosis present

## 2018-12-16 DIAGNOSIS — Z91040 Latex allergy status: Secondary | ICD-10-CM

## 2018-12-16 DIAGNOSIS — Z9889 Other specified postprocedural states: Secondary | ICD-10-CM

## 2018-12-16 DIAGNOSIS — N800 Endometriosis of uterus: Secondary | ICD-10-CM | POA: Diagnosis present

## 2018-12-16 DIAGNOSIS — Z01818 Encounter for other preprocedural examination: Secondary | ICD-10-CM

## 2018-12-16 DIAGNOSIS — Z833 Family history of diabetes mellitus: Secondary | ICD-10-CM

## 2018-12-16 DIAGNOSIS — N736 Female pelvic peritoneal adhesions (postinfective): Secondary | ICD-10-CM | POA: Diagnosis present

## 2018-12-16 DIAGNOSIS — N920 Excessive and frequent menstruation with regular cycle: Secondary | ICD-10-CM | POA: Diagnosis present

## 2018-12-16 HISTORY — PX: LYSIS OF ADHESION: SHX5961

## 2018-12-16 HISTORY — PX: LAPAROSCOPY: SHX197

## 2018-12-16 HISTORY — PX: HYSTERECTOMY ABDOMINAL WITH SALPINGECTOMY: SHX6725

## 2018-12-16 HISTORY — PX: ABDOMINAL HYSTERECTOMY: SHX81

## 2018-12-16 LAB — GLUCOSE, CAPILLARY
Glucose-Capillary: 135 mg/dL — ABNORMAL HIGH (ref 70–99)
Glucose-Capillary: 159 mg/dL — ABNORMAL HIGH (ref 70–99)
Glucose-Capillary: 189 mg/dL — ABNORMAL HIGH (ref 70–99)
Glucose-Capillary: 231 mg/dL — ABNORMAL HIGH (ref 70–99)

## 2018-12-16 SURGERY — LAPAROSCOPY, DIAGNOSTIC
Anesthesia: General | Site: Abdomen

## 2018-12-16 SURGERY — HYSTERECTOMY, VAGINAL, LAPAROSCOPY-ASSISTED, WITH SALPINGECTOMY
Anesthesia: Choice | Laterality: Bilateral

## 2018-12-16 MED ORDER — CEFAZOLIN SODIUM-DEXTROSE 2-4 GM/100ML-% IV SOLN
INTRAVENOUS | Status: AC
  Filled 2018-12-16: qty 100

## 2018-12-16 MED ORDER — INSULIN ASPART 100 UNIT/ML ~~LOC~~ SOLN
12.0000 [IU] | Freq: Three times a day (TID) | SUBCUTANEOUS | Status: DC
  Administered 2018-12-17 – 2018-12-18 (×4): 12 [IU] via SUBCUTANEOUS

## 2018-12-16 MED ORDER — ACETAMINOPHEN 500 MG PO TABS
1000.0000 mg | ORAL_TABLET | Freq: Once | ORAL | Status: AC
  Administered 2018-12-16: 1000 mg via ORAL

## 2018-12-16 MED ORDER — KETOROLAC TROMETHAMINE 30 MG/ML IJ SOLN
INTRAMUSCULAR | Status: DC | PRN
  Administered 2018-12-16: 30 mg via INTRAVENOUS

## 2018-12-16 MED ORDER — OXYCODONE HCL 5 MG PO TABS: 5.0000 mg | ORAL_TABLET | Freq: Once | ORAL | Status: DC | PRN

## 2018-12-16 MED ORDER — DEXMEDETOMIDINE HCL 200 MCG/2ML IV SOLN
INTRAVENOUS | Status: DC | PRN
  Administered 2018-12-16 (×2): 4 ug via INTRAVENOUS
  Administered 2018-12-16 (×2): 8 ug via INTRAVENOUS

## 2018-12-16 MED ORDER — PROPOFOL 10 MG/ML IV BOLUS
INTRAVENOUS | Status: DC | PRN
  Administered 2018-12-16: 200 mg via INTRAVENOUS

## 2018-12-16 MED ORDER — KETOROLAC TROMETHAMINE 30 MG/ML IJ SOLN: 30.0000 mg | Freq: Once | INTRAMUSCULAR | Status: DC | PRN

## 2018-12-16 MED ORDER — FENTANYL CITRATE (PF) 100 MCG/2ML IJ SOLN
INTRAMUSCULAR | Status: AC
  Filled 2018-12-16: qty 2

## 2018-12-16 MED ORDER — IBUPROFEN 800 MG PO TABS
800.0000 mg | ORAL_TABLET | Freq: Three times a day (TID) | ORAL | Status: DC
  Administered 2018-12-16 – 2018-12-18 (×5): 800 mg via ORAL
  Filled 2018-12-16 (×5): qty 1

## 2018-12-16 MED ORDER — HYDROCODONE-ACETAMINOPHEN 5-325 MG PO TABS
2.0000 | ORAL_TABLET | Freq: Once | ORAL | Status: AC
  Administered 2018-12-16: 21:00:00 2 via ORAL
  Filled 2018-12-16: qty 2

## 2018-12-16 MED ORDER — INSULIN LISPRO (1 UNIT DIAL) 100 UNIT/ML (KWIKPEN)
12.0000 [IU] | PEN_INJECTOR | Freq: Three times a day (TID) | SUBCUTANEOUS | Status: DC
  Filled 2018-12-16: qty 3

## 2018-12-16 MED ORDER — ACETAMINOPHEN 500 MG PO TABS
ORAL_TABLET | ORAL | Status: AC
  Filled 2018-12-16: qty 2

## 2018-12-16 MED ORDER — ONDANSETRON HCL 4 MG/2ML IJ SOLN: 4.0000 mg | Freq: Once | INTRAMUSCULAR | Status: DC | PRN

## 2018-12-16 MED ORDER — SCOPOLAMINE 1 MG/3DAYS TD PT72
1.0000 | MEDICATED_PATCH | Freq: Once | TRANSDERMAL | Status: DC
  Administered 2018-12-16: 12:00:00 1.5 mg via TRANSDERMAL

## 2018-12-16 MED ORDER — FENTANYL CITRATE (PF) 100 MCG/2ML IJ SOLN
INTRAMUSCULAR | Status: DC | PRN
  Administered 2018-12-16 (×4): 50 ug via INTRAVENOUS
  Administered 2018-12-16: 100 ug via INTRAVENOUS

## 2018-12-16 MED ORDER — ONDANSETRON HCL 4 MG PO TABS: 4.0000 mg | ORAL_TABLET | Freq: Four times a day (QID) | ORAL | Status: DC | PRN

## 2018-12-16 MED ORDER — ROCURONIUM BROMIDE 100 MG/10ML IV SOLN
INTRAVENOUS | Status: DC | PRN
  Administered 2018-12-16: 10 mg via INTRAVENOUS
  Administered 2018-12-16: 60 mg via INTRAVENOUS
  Administered 2018-12-16: 20 mg via INTRAVENOUS

## 2018-12-16 MED ORDER — PROPOFOL 10 MG/ML IV BOLUS
INTRAVENOUS | Status: AC
  Filled 2018-12-16: qty 20

## 2018-12-16 MED ORDER — HYDROMORPHONE HCL 1 MG/ML IJ SOLN
INTRAMUSCULAR | Status: AC
  Filled 2018-12-16: qty 0.5

## 2018-12-16 MED ORDER — LACTATED RINGERS IV SOLN
INTRAVENOUS | Status: DC
  Administered 2018-12-16: 12:00:00 via INTRAVENOUS

## 2018-12-16 MED ORDER — BUPIVACAINE HCL 0.25 % IJ SOLN
INTRAMUSCULAR | Status: DC | PRN
  Administered 2018-12-16: 24 mL

## 2018-12-16 MED ORDER — MIDAZOLAM HCL 5 MG/5ML IJ SOLN
INTRAMUSCULAR | Status: DC | PRN
  Administered 2018-12-16: 2 mg via INTRAVENOUS

## 2018-12-16 MED ORDER — SCOPOLAMINE 1 MG/3DAYS TD PT72
MEDICATED_PATCH | TRANSDERMAL | Status: AC
  Filled 2018-12-16: qty 1

## 2018-12-16 MED ORDER — INSULIN DETEMIR 100 UNIT/ML ~~LOC~~ SOLN
10.0000 [IU] | Freq: Every day | SUBCUTANEOUS | Status: DC
  Administered 2018-12-16 – 2018-12-17 (×2): 10 [IU] via SUBCUTANEOUS
  Filled 2018-12-16 (×3): qty 0.1

## 2018-12-16 MED ORDER — OXYCODONE HCL 5 MG/5ML PO SOLN: 5.0000 mg | Freq: Once | ORAL | Status: DC | PRN

## 2018-12-16 MED ORDER — DEXAMETHASONE SODIUM PHOSPHATE 10 MG/ML IJ SOLN
INTRAMUSCULAR | Status: AC
  Filled 2018-12-16: qty 1

## 2018-12-16 MED ORDER — DEXAMETHASONE SODIUM PHOSPHATE 10 MG/ML IJ SOLN
INTRAMUSCULAR | Status: DC | PRN
  Administered 2018-12-16: 5 mg via INTRAVENOUS

## 2018-12-16 MED ORDER — ONDANSETRON HCL 4 MG/2ML IJ SOLN
INTRAMUSCULAR | Status: DC | PRN
  Administered 2018-12-16: 4 mg via INTRAVENOUS

## 2018-12-16 MED ORDER — MIDAZOLAM HCL 2 MG/2ML IJ SOLN
INTRAMUSCULAR | Status: AC
  Filled 2018-12-16: qty 2

## 2018-12-16 MED ORDER — HYDROMORPHONE HCL 1 MG/ML IJ SOLN
1.0000 mg | INTRAMUSCULAR | Status: DC | PRN
  Administered 2018-12-16: 23:00:00 2 mg via INTRAVENOUS
  Administered 2018-12-17: 1 mg via INTRAVENOUS
  Administered 2018-12-17: 03:00:00 2 mg via INTRAVENOUS
  Administered 2018-12-17 – 2018-12-18 (×8): 1 mg via INTRAVENOUS
  Filled 2018-12-16 (×2): qty 1
  Filled 2018-12-16: qty 2
  Filled 2018-12-16 (×3): qty 1
  Filled 2018-12-16: qty 2
  Filled 2018-12-16 (×4): qty 1

## 2018-12-16 MED ORDER — ONDANSETRON HCL 4 MG/2ML IJ SOLN: 4.0000 mg | Freq: Four times a day (QID) | INTRAMUSCULAR | Status: DC | PRN

## 2018-12-16 MED ORDER — KETOROLAC TROMETHAMINE 30 MG/ML IJ SOLN
INTRAMUSCULAR | Status: AC
  Filled 2018-12-16: qty 1

## 2018-12-16 MED ORDER — CEFAZOLIN SODIUM-DEXTROSE 2-4 GM/100ML-% IV SOLN
2.0000 g | INTRAVENOUS | Status: AC
  Administered 2018-12-16: 14:00:00 2 g via INTRAVENOUS

## 2018-12-16 MED ORDER — ONDANSETRON HCL 4 MG/2ML IJ SOLN
INTRAMUSCULAR | Status: AC
  Filled 2018-12-16: qty 2

## 2018-12-16 MED ORDER — HYDROMORPHONE HCL 1 MG/ML IJ SOLN
0.2500 mg | INTRAMUSCULAR | Status: DC | PRN
  Administered 2018-12-16 (×3): 0.5 mg via INTRAVENOUS

## 2018-12-16 MED ORDER — LACTATED RINGERS IV SOLN
INTRAVENOUS | Status: DC | PRN
  Administered 2018-12-16 (×2): via INTRAVENOUS

## 2018-12-16 MED ORDER — PHENYLEPHRINE 40 MCG/ML (10ML) SYRINGE FOR IV PUSH (FOR BLOOD PRESSURE SUPPORT)
PREFILLED_SYRINGE | INTRAVENOUS | Status: AC
  Filled 2018-12-16: qty 10

## 2018-12-16 MED ORDER — HYDROCODONE-ACETAMINOPHEN 5-325 MG PO TABS
1.0000 | ORAL_TABLET | ORAL | Status: DC | PRN
  Administered 2018-12-16: 19:00:00 1 via ORAL
  Filled 2018-12-16: qty 1

## 2018-12-16 MED ORDER — LIDOCAINE 2% (20 MG/ML) 5 ML SYRINGE
INTRAMUSCULAR | Status: DC | PRN
  Administered 2018-12-16: 40 mg via INTRAVENOUS
  Administered 2018-12-16: 60 mg via INTRAVENOUS

## 2018-12-16 MED ORDER — SUGAMMADEX SODIUM 200 MG/2ML IV SOLN
INTRAVENOUS | Status: DC | PRN
  Administered 2018-12-16: 300 mg via INTRAVENOUS

## 2018-12-16 SURGICAL SUPPLY — 50 items
APL SRG 38 LTWT LNG FL B (MISCELLANEOUS)
APL SWBSTK 6 STRL LF DISP (MISCELLANEOUS) ×3
APPLICATOR ARISTA FLEXITIP XL (MISCELLANEOUS) IMPLANT
APPLICATOR COTTON TIP 6 STRL (MISCELLANEOUS) ×3 IMPLANT
APPLICATOR COTTON TIP 6IN STRL (MISCELLANEOUS) ×5
CANISTER SUCT 3000ML PPV (MISCELLANEOUS) ×5 IMPLANT
COVER MAYO STAND REUSABLE (DRAPES) ×5 IMPLANT
DECANTER SPIKE VIAL GLASS SM (MISCELLANEOUS) ×10 IMPLANT
DRSG OPSITE POSTOP 3X4 (GAUZE/BANDAGES/DRESSINGS) ×5 IMPLANT
DRSG TEGADERM 4X10 (GAUZE/BANDAGES/DRESSINGS) ×5 IMPLANT
DRSG TELFA 3X8 NADH (GAUZE/BANDAGES/DRESSINGS) ×5 IMPLANT
DURAPREP 26ML APPLICATOR (WOUND CARE) ×5 IMPLANT
ELECT REM PT RETURN 9FT ADLT (ELECTROSURGICAL) ×5
ELECTRODE REM PT RTRN 9FT ADLT (ELECTROSURGICAL) ×3 IMPLANT
GAUZE 4X4 16PLY RFD (DISPOSABLE) ×5 IMPLANT
GLOVE BIO SURGEON STRL SZ 6.5 (GLOVE) ×12 IMPLANT
GLOVE BIO SURGEONS STRL SZ 6.5 (GLOVE) ×3
GLOVE BIOGEL PI IND STRL 7.0 (GLOVE) ×6 IMPLANT
GLOVE BIOGEL PI INDICATOR 7.0 (GLOVE) ×4
GOWN STRL REUS W/ TWL LRG LVL3 (GOWN DISPOSABLE) ×9 IMPLANT
GOWN STRL REUS W/TWL LRG LVL3 (GOWN DISPOSABLE) ×15
HEMOSTAT ARISTA ABSORB 3G PWDR (HEMOSTASIS) ×5 IMPLANT
NDL SAFETY ECLIPSE 18X1.5 (NEEDLE) ×3 IMPLANT
NEEDLE HYPO 18GX1.5 SHARP (NEEDLE) ×4
NEEDLE INSUFFLATION 120MM (ENDOMECHANICALS) ×5 IMPLANT
NEEDLE MAYO 6 CRC TAPER PT (NEEDLE) ×5 IMPLANT
NEEDLE SPNL 18GX3.5 QUINCKE PK (NEEDLE) ×5 IMPLANT
NS IRRIG 1000ML POUR BTL (IV SOLUTION) ×5 IMPLANT
PACK LAVH (CUSTOM PROCEDURE TRAY) ×5 IMPLANT
PACK TRENDGUARD 450 HYBRID PRO (MISCELLANEOUS) ×3 IMPLANT
PAD OB MATERNITY 4.3X12.25 (PERSONAL CARE ITEMS) ×5 IMPLANT
SET IRRIG TUBING LAPAROSCOPIC (IRRIGATION / IRRIGATOR) IMPLANT
SHEARS HARMONIC ACE PLUS 36CM (ENDOMECHANICALS) IMPLANT
SLEEVE SCD COMPRESS KNEE MED (MISCELLANEOUS) ×10 IMPLANT
SLEEVE XCEL OPT CAN 5 100 (ENDOMECHANICALS) ×10 IMPLANT
SPONGE LAP 18X18 RF (DISPOSABLE) ×15 IMPLANT
SUT PDS AB 0 CT 36 (SUTURE) ×5 IMPLANT
SUT VIC AB 0 CT1 36 (SUTURE) ×5 IMPLANT
SUT VIC AB 2-0 CT1 18 (SUTURE) ×15 IMPLANT
SUT VIC AB 3-0 CT1 27 (SUTURE) ×5
SUT VIC AB 3-0 CT1 TAPERPNT 27 (SUTURE) ×3 IMPLANT
SUT VICRYL 0 UR6 27IN ABS (SUTURE) ×10 IMPLANT
SUT VICRYL 4-0 PS2 18IN ABS (SUTURE) ×15 IMPLANT
SYR 30ML LL (SYRINGE) ×5 IMPLANT
SYR BULB IRRIGATION 50ML (SYRINGE) ×5 IMPLANT
TOWEL GREEN STERILE FF (TOWEL DISPOSABLE) ×15 IMPLANT
TRAY FOLEY W/BAG SLVR 14FR LF (SET/KITS/TRAYS/PACK) ×5 IMPLANT
TRENDGUARD 450 HYBRID PRO PACK (MISCELLANEOUS) ×5
TROCAR OPTI TIP 5M 100M (ENDOMECHANICALS) ×5 IMPLANT
WARMER LAPAROSCOPE (MISCELLANEOUS) ×5 IMPLANT

## 2018-12-16 NOTE — Transfer of Care (Signed)
Immediate Anesthesia Transfer of Care Note  Patient: Tamara Green  Procedure(s) Performed: LAPAROSCOPY DIAGNOSTIC (N/A Abdomen) LYSIS OF ADHESION (N/A Abdomen) HYSTERECTOMY ABDOMINAL WITH BILATERAL SALPINGECTOMY (Bilateral Abdomen)  Patient Location: PACU  Anesthesia Type:General  Level of Consciousness: awake, alert  and oriented  Airway & Oxygen Therapy: Patient Spontanous Breathing and Patient connected to face mask oxygen  Post-op Assessment: Report given to RN and Post -op Vital signs reviewed and stable  Post vital signs: Reviewed and stable  Last Vitals:  Vitals Value Taken Time  BP 141/74 12/16/18 1633  Temp    Pulse 80 12/16/18 1635  Resp 27 12/16/18 1635  SpO2 100 % 12/16/18 1635  Vitals shown include unvalidated device data.  Last Pain:  Vitals:   12/16/18 1142  TempSrc: Tympanic  PainSc:       Patients Stated Pain Goal: 5 ([id number])  Complications: No apparent anesthesia complications

## 2018-12-16 NOTE — Anesthesia Procedure Notes (Signed)
Procedure Name: Intubation Date/Time: 12/16/2018 2:08 PM Performed by: Gwyndolyn Saxon, CRNA Pre-anesthesia Checklist: Patient identified, Emergency Drugs available, Suction available and Patient being monitored Patient Re-evaluated:Patient Re-evaluated prior to induction Oxygen Delivery Method: Circle system utilized Preoxygenation: Pre-oxygenation with 100% oxygen Induction Type: IV induction Ventilation: Mask ventilation without difficulty Laryngoscope Size: Miller and 2 Grade View: Grade I Tube type: Oral Tube size: 7.0 mm Number of attempts: 1 Airway Equipment and Method: Patient positioned with wedge pillow and Stylet Placement Confirmation: ETT inserted through vocal cords under direct vision,  positive ETCO2 and breath sounds checked- equal and bilateral Secured at: 21 cm Tube secured with: Tape Dental Injury: Teeth and Oropharynx as per pre-operative assessment

## 2018-12-16 NOTE — Progress Notes (Signed)
PAtietn needed pain medication before available. Dr on call approved one time order for earlier administration. This nurse medicated patient. Patient states medication ineffective. This nurse reached out to Dr again and relayed information. Dr on call putting a new pain med in and when approved this nurse will administer to patient.

## 2018-12-16 NOTE — Anesthesia Postprocedure Evaluation (Signed)
Anesthesia Post Note  Patient: Tamara Green  Procedure(s) Performed: LAPAROSCOPY DIAGNOSTIC (N/A Abdomen) LYSIS OF ADHESION (N/A Abdomen) HYSTERECTOMY ABDOMINAL WITH BILATERAL SALPINGECTOMY (Bilateral Abdomen)     Patient location during evaluation: PACU Anesthesia Type: General Level of consciousness: awake and alert Pain management: pain level controlled Vital Signs Assessment: post-procedure vital signs reviewed and stable Respiratory status: spontaneous breathing, nonlabored ventilation, respiratory function stable and patient connected to nasal cannula oxygen Cardiovascular status: blood pressure returned to baseline and stable Postop Assessment: no apparent nausea or vomiting Anesthetic complications: no    Last Vitals:  Vitals:   12/16/18 1700 12/16/18 1715  BP: (!) 141/77 (!) 142/80  Pulse: 76 75  Resp: 14 17  Temp:    SpO2: 99% 99%    Last Pain:  Vitals:   12/16/18 1715  TempSrc:   PainSc: 8                  Bettylou Frew DAVID

## 2018-12-16 NOTE — H&P (Signed)
Tamara Green is an 38 y.o. married P4 here for a LAVH/BS due to heavy periods. She has been trying medical management but to no avail. Her u/s showed a large uterus with fibroids and probable adenomyosis. 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 She had a BTL last year. She had 1 cesarean section with her last child. She generally has some amount of bleeding every day. She has had both iron and blood transfusions in the past, last done 2/20.  Patient's last menstrual period was 12/02/2018.    Past Medical History:  Diagnosis Date  . Anemia    no current med.  . Diabetes mellitus type 2 in obese ([place])   . Heartburn    no current med.  Marland Kitchen History of blood transfusion 01/2014  . Rash of vulva 09/02/2017  . Rheumatoid arthritis(714.0)    no current med.  . Sensitive skin     Past Surgical History:  Procedure Laterality Date  . BREAST BIOPSY Right   . CERVICAL CERCLAGE  07/10/2000  . CERVICAL CERCLAGE N/A 09/17/2014   Procedure: CERCLAGE CERVICAL;  Surgeon: Shelly Bombard, MD;  Location: [facility];  Service: Gynecology;  Laterality: N/A;  . CERVICAL CERCLAGE  10/30/2001  . CERVICAL CERCLAGE  05/02/2006  . CERVICAL CONIZATION W/BX N/A 09/10/2017   Procedure: COLD KNIFE CONIZATION;  Surgeon: Emily Filbert, MD;  Location: [place];  Service: Gynecology;  Laterality: N/A;  . CESAREAN SECTION N/A 12/11/2014   Procedure: CESAREAN SECTION;  Surgeon: Frederico Hamman, MD;  Location: [facility];  Service: Obstetrics;  Laterality: N/A;  . LAPAROSCOPIC TUBAL LIGATION Bilateral 09/10/2017   Procedure: LAPAROSCOPIC TUBAL LIGATION - FILSHIE CLIPS;  Surgeon: Emily Filbert, MD;  Location: [place];  Service: Gynecology;  Laterality: Bilateral;    Family History  Problem Relation Age of Onset  . Diabetes Mother     Social History:  reports that she quit smoking about 6 years ago. She has never used smokeless tobacco. She reports  current alcohol use. She reports that she does not use drugs.  Allergies:  Allergies  Allergen Reactions  . Percocet [Oxycodone-Acetaminophen] Hives  . Latex Rash    Medications Prior to Admission  Medication Sig Dispense Refill Last Dose  . Ferrous Sulfate (IRON PO) Take 36 mg by mouth daily.   12/15/2018 at Unknown time  . HUMALOG KWIKPEN 100 UNIT/ML KwikPen Inject 12 Units into the skin 3 (three) times daily.    12/15/2018 at Unknown time  . megestrol (MEGACE) 40 MG tablet Take 1 tablet (40 mg total) by mouth 2 (two) times daily. 60 tablet 5 Past Week at Unknown time  . norgestrel-ethinyl estradiol (LO/OVRAL,CRYSELLE) 0.3-30 MG-MCG tablet Take 1 tablet by mouth daily. 1 Package 11 Past Week at Unknown time  . Semaglutide,0.25 or 0.5MG /DOS, (OZEMPIC, 0.25 OR 0.5 MG/DOSE,) 2 MG/1.5ML SOPN Inject 0.25 mg into the skin once a week. 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 She has some dyspareunia, at times.  Blood pressure (!) 142/77, pulse 86, temperature (!) 96.2 F (35.7 C), temperature source Tympanic, resp. rate 18, height 5\' 8"  (1.727 m), weight 108.5 kg, last menstrual period 12/02/2018, SpO2 100 %, not currently breastfeeding. Physical Exam  Breathing, conversing, and ambulating normally Well nourished, well hydrated Black female,  no apparent distress Heart- rrr Lungs- CTAB Abd- benign  Results for orders placed or performed during the hospital encounter of 12/16/18 (from the past 24 hour(s))  Glucose, capillary     Status: Abnormal   Collection Time: 12/16/18 11:43 AM  Result Value Ref Range   Glucose-Capillary 135 (H) 70 - 99 mg/dL    No results found.  Assessment/Plan: Symptomatic fibroids, adenomyosis- plan for LAVH, BS  She understands the risks of surgery, including, but not to infection, bleeding, DVTs, damage to bowel, bladder, ureters. She  wishes to proceed.     Emily Filbert 12/16/2018, 1:41 PM

## 2018-12-16 NOTE — Op Note (Addendum)
12/16/2018  4:09 PM  PATIENT:  Tamara Green  38 y.o. female  PRE-OPERATIVE DIAGNOSIS:   Menorrhagia, anemia, dyspareunia, morbid obesity  POST-OPERATIVE DIAGNOSIS:  Same + dense adhesions  PROCEDURE:  Procedure(s): LAPAROSCOPY DIAGNOSTIC (N/A) LYSIS OF ADHESION (N/A) HYSTERECTOMY ABDOMINAL WITH BILATERAL SALPINGECTOMY (Bilateral)  SURGEON:  Surgeon(s) and Role:    * Arieana Somoza, Wilhemina Cash, MD - Primary    * Anyanwu, Sallyanne Havers, MD - Assisting   ASSISTANTS: Acquanetta Sit, MS3   ANESTHESIA:   local and general  EBL:  300 mL   BLOOD ADMINISTERED:none  DRAINS: Urinary Catheter (Foley)   LOCAL MEDICATIONS USED:  MARCAINE     SPECIMEN:  Source of Specimen:  uterus and tubes  DISPOSITION OF SPECIMEN:  PATHOLOGY  COUNTS:  YES  TOURNIQUET:  * No tourniquets in log *  DICTATION: .Dragon Dictation  PLAN OF CARE: Admit to inpatient   PATIENT DISPOSITION:  PACU - hemodynamically stable.   Delay start of Pharmacological VTE agent (>24hrs) due to surgical blood loss or risk of bleeding: not applicable   The risks, benefits, and alternatives of surgery were explained, accepted, and understood. Consents were signed. She was taken to the operating room and placed in the dorsal lithotomy position. When she was comfortable, general anesthesia was applied without complication. Her abdomen and vagina were prepped and draped in the usual sterile fashion. A bimanual exam revealed a 10 week size anteverted uterus, her adnexa were not palpable. A Hulka manipulator was placed. A Foley catheter was placed and a drained clear urine throughout the case. Gloves were changed and attention was turned to the abdomen. 0.5% Marcaine was used to inject at all the incision sites prior to making an incision. A 5 mm incision was made in the left upper quadrant. An OG tube was in place.  A  Veress needle was placed intraperitoneally. Low-flow CO2 was used to insufflate the abdomen to approximately 3 L. After  an excellent pneumoperitoneum was established a 5 mm trocar was placed. Laparoscopy confirmed correct placement. Under direct laparoscopic visualization I placed a 5 mm port in each lower quadrant. She was placed in the Trendelenburg position. The pelvis was inspected. Both adnexa were normal. There were dense omental adhesions to the midportion of the anterior abdominal wall.  We used a harmonic scalpel to take down the adhesions, taking care to avoid the bowel. I was then able to see the uterus which had extremely dense adhesions to the bladder. I felt that proceeding with laparoscopic removal of these adhesions had a high likelihood of damaging her bladder so I made the decision to convert to an abdominal approach. The Hulka manipulator was removed. We proceeded with an abdominal approach. I made an incision at the site of her Cesarean section. I carried the incision down through the subcutaneous tissue and scored the fascia in the midline. I extended the fascial incision bilaterally. I then used the Bovie to separate the middle 1/3 of the rectus muscles in a transverse fashion. I entered the peritoneum and used moist sponges to pack the bowel out of the pelvis. The large bowel had a lot of air in them. I used sharp dissection to carefully take down the adhesions between the uterus and the bladder. I placed an O'Conner O'Sulivan self retaining retractor. The round ligaments were clamped, cut, and ligated.      A bladder flap was created anteriorly and the bladder was pushed out of the operative site with a moist lap sponge. The  uteroovarian ligaments were identified bilaterally. They were clamped, cut, and ligated. Excellent hemostasis was noted. 2-0 Vicryl sutures used throughout this case unless otherwise specified. The uterine vessels were skeletonized, clamped, cut, and doubly ligated. I amputated the uterus to allow for better visualization. The remainder of the cervix was separated from its pelvic  attachments using the same clamp, cut, ligate technique. Curved Heaney clamps were used to clamp beneath the cervix. The cervix and uterus were removed and sent to pathology. The vaginal cuff was noted to be hemostatic after placing 2 figure of eight sutures. I then excised both oviducts after clamping, cutting, and ligating them.  All pedicles were noted to be hemostatic. I placed Arista over the cuff and pedicles. The ureters were noted to be functioning and of normal caliber. The sponges were removed from the pelvis. The rectus muscles were inspected and hemostasis was assured. The fascia was closed with a PDS loop in a running nonlocking suture. The subcutaneous tissue was irrigated, clean, dry.  A subcuticular closure was done with 3-0 vicryl suture. The laparoscopic port sites were closed with 3-0 vicryl suture.  She tolerated the procedure well and was taken to the recovery room in stable condition. Her Foley catheter drained clear urine throughout.      An experienced assistant was required given the standard of surgical care given the complexity of the case.  This assistant was needed for exposure, dissection, suctioning, retraction, instrument exchange, assisting with delivery with administration of fundal pressure, and for overall help during the procedure.  ----- Message -----

## 2018-12-17 ENCOUNTER — Encounter (HOSPITAL_COMMUNITY): Payer: Self-pay | Admitting: General Practice

## 2018-12-17 LAB — CBC
HCT: 21.4 % — ABNORMAL LOW (ref 36.0–46.0)
Hemoglobin: 6.4 g/dL — CL (ref 12.0–15.0)
MCH: 18.4 pg — ABNORMAL LOW (ref 26.0–34.0)
MCHC: 29.9 g/dL — ABNORMAL LOW (ref 30.0–36.0)
MCV: 61.5 fL — ABNORMAL LOW (ref 80.0–100.0)
Platelets: 429 10*3/uL — ABNORMAL HIGH (ref 150–400)
RBC: 3.48 MIL/uL — ABNORMAL LOW (ref 3.87–5.11)
RDW: 17.2 % — ABNORMAL HIGH (ref 11.5–15.5)
WBC: 14.6 10*3/uL — ABNORMAL HIGH (ref 4.0–10.5)
nRBC: 0 % (ref 0.0–0.2)

## 2018-12-17 LAB — GLUCOSE, CAPILLARY
Glucose-Capillary: 195 mg/dL — ABNORMAL HIGH (ref 70–99)
Glucose-Capillary: 129 mg/dL — ABNORMAL HIGH (ref 70–99)
Glucose-Capillary: 123 mg/dL — ABNORMAL HIGH (ref 70–99)
Glucose-Capillary: 129 mg/dL — ABNORMAL HIGH (ref 70–99)

## 2018-12-17 LAB — HEMOGLOBIN AND HEMATOCRIT, BLOOD
HCT: 26.1 % — ABNORMAL LOW (ref 36.0–46.0)
Hemoglobin: 8.2 g/dL — ABNORMAL LOW (ref 12.0–15.0)

## 2018-12-17 LAB — PREPARE RBC (CROSSMATCH)

## 2018-12-17 MED ORDER — SODIUM CHLORIDE 0.9% IV SOLUTION
Freq: Once | INTRAVENOUS | Status: AC
  Administered 2018-12-17: 09:00:00 via INTRAVENOUS

## 2018-12-17 NOTE — Progress Notes (Signed)
1 Day Post-Op Procedure(s) (LRB): LAPAROSCOPY DIAGNOSTIC (N/A) LYSIS OF ADHESION (N/A) HYSTERECTOMY ABDOMINAL WITH BILATERAL SALPINGECTOMY (Bilateral)  Subjective: Patient reports incisional pain, tolerating PO and no problems voiding. She is having some dizziness. She has had both IV iron and blood transfusions in the past. She does not have a preference of which she gets today.  Objective: I have reviewed patient's vital signs, intake and output, medications and labs.  General: alert Resp: clear to auscultation bilaterally Cardio: regular rate and rhythm, S1, S2 normal, no murmur, click, rub or gallop GI: soft, non-tender; bowel sounds normal; no masses,  no organomegaly Extremities: extremities normal, atraumatic, no cyanosis or edema Dressing: some old blood, intact  Assessment: s/p Procedure(s): LAPAROSCOPY DIAGNOSTIC (N/A) LYSIS OF ADHESION (N/A) HYSTERECTOMY ABDOMINAL WITH BILATERAL SALPINGECTOMY (Bilateral): stable ANEMIA, symptomatic  Plan: Encourage ambulation transfuse 2 units of PRBCs, follow CBC  LOS: 1 day    Tamara Green C Jeyson Deshotel 12/17/2018, 7:59 AM

## 2018-12-17 NOTE — Progress Notes (Signed)
Critical Hgb called of 6.4. Dr on call made aware

## 2018-12-17 NOTE — Plan of Care (Signed)
  Problem: Education: Goal: Knowledge of General Education information will improve Description: Including pain rating scale, medication(s)/side effects and non-pharmacologic comfort measures Outcome: Progressing   Problem: Health Behavior/Discharge Planning: Goal: Ability to manage health-related needs will improve Outcome: Progressing   Problem: Clinical Measurements: Goal: Ability to maintain clinical measurements within normal limits will improve Outcome: Progressing Goal: Respiratory complications will improve Outcome: Progressing   Problem: Activity: Goal: Risk for activity intolerance will decrease Outcome: Progressing   Problem: Nutrition: Goal: Adequate nutrition will be maintained Outcome: Progressing   Problem: Elimination: Goal: Will not experience complications related to urinary retention Outcome: Progressing   Problem: Pain Managment: Goal: General experience of comfort will improve Outcome: Progressing   Problem: Safety: Goal: Ability to remain free from injury will improve Outcome: Progressing   Problem: Skin Integrity: Goal: Risk for impaired skin integrity will decrease Outcome: Progressing

## 2018-12-18 LAB — TYPE AND SCREEN
ABO/RH(D): O NEG
Antibody Screen: NEGATIVE
Unit division: 0
Unit division: 0

## 2018-12-18 LAB — GLUCOSE, CAPILLARY
Glucose-Capillary: 124 mg/dL — ABNORMAL HIGH (ref 70–99)
Glucose-Capillary: 104 mg/dL — ABNORMAL HIGH (ref 70–99)

## 2018-12-18 LAB — CBC
HCT: 26.7 % — ABNORMAL LOW (ref 36.0–46.0)
Hemoglobin: 8.1 g/dL — ABNORMAL LOW (ref 12.0–15.0)
MCH: 20.4 pg — ABNORMAL LOW (ref 26.0–34.0)
MCHC: 30.3 g/dL (ref 30.0–36.0)
MCV: 67.3 fL — ABNORMAL LOW (ref 80.0–100.0)
Platelets: 391 10*3/uL (ref 150–400)
RBC: 3.97 MIL/uL (ref 3.87–5.11)
RDW: 21.2 % — ABNORMAL HIGH (ref 11.5–15.5)
WBC: 12.5 10*3/uL — ABNORMAL HIGH (ref 4.0–10.5)
nRBC: 0.2 % (ref 0.0–0.2)

## 2018-12-18 LAB — BPAM RBC
Blood Product Expiration Date: 202012112359
Blood Product Expiration Date: 202012122359
ISSUE DATE / TIME: 202012091121
ISSUE DATE / TIME: 202012091408
Unit Type and Rh: 9500
Unit Type and Rh: 9500

## 2018-12-18 LAB — SURGICAL PATHOLOGY

## 2018-12-18 MED ORDER — IBUPROFEN 800 MG PO TABS: 800.0000 mg | ORAL_TABLET | Freq: Three times a day (TID) | ORAL | 0 refills | Status: AC

## 2018-12-18 MED ORDER — HYDROMORPHONE HCL 2 MG PO TABS: 2.0000 mg | ORAL_TABLET | ORAL | 0 refills | Status: AC | PRN

## 2018-12-18 MED ORDER — HYDROCODONE-ACETAMINOPHEN 5-325 MG PO TABS: 1.0000 | ORAL_TABLET | ORAL | 0 refills | Status: AC | PRN

## 2018-12-18 MED FILL — HYDROCODON-APAP 5-325: 5-325 | 4 days supply | Qty: 30 | Fill #0

## 2018-12-18 MED FILL — IBUPROFEN 800 MG TAB: 800 | 10 days supply | Qty: 30 | Fill #0

## 2018-12-18 MED FILL — HYDROmorphone HCL 2 MG TABS: 2 | 5 days supply | Qty: 30 | Fill #0

## 2018-12-18 NOTE — Discharge Instructions (Signed)
Abdominal Hysterectomy, Care After This sheet gives you information about how to care for yourself after your procedure. Your health care provider may also give you more specific instructions. If you have problems or questions, contact your health care provider. What can I expect after the procedure? After the procedure, it is common to have:  Pain.  Tiredness (fatigue).  Poor appetite.  Lowered interest in sex.  Bleeding and discharge from your vagina. You may need to use a pad in your underwear after this procedure. Follow these instructions at home: Medicines  Take over-the-counter and prescription medicines only as told by your doctor.  Do not take aspirin or ibuprofen. These medicines can cause bleeding.  Ask your doctor if the medicine prescribed to you: ? Requires you to avoid driving or using heavy machinery. ? Can cause trouble pooping (constipation). You may need to take these actions to prevent or treat trouble pooping:  Take over-the-counter or prescription medicines.  Eat foods that are high in fiber. These include beans, whole grains, and fresh fruits and vegetables.  Limit foods that are high in fat and processed sugars. These include fried or sweet foods. Surgical cut (incision) care      Follow instructions from your doctor about how to take care of your cut from surgery (incision). Make sure you: ? Wash your hands with soap and water before and after you change your bandage (dressing). If you cannot use soap and water, use hand sanitizer. ? Change your bandage as told by your doctor. ? Leave stitches (sutures), skin glue, or skin tape (adhesive) strips in place. They may need to stay in place for 2 weeks or longer. If tape strips get loose and curl up, you may trim the loose edges. Do not remove tape strips completely unless your doctor says it is okay.  Check your cut from surgery every day for signs of infection. Check for: ? Redness, swelling, or  pain. ? Fluid or blood. ? Warmth. ? Pus or a bad smell. Activity  Rest as told by your doctor. ? Do not sit for a long time without moving. Get up to take short walks every 1-2 hours. This is important. Ask for help if you feel weak or unsteady.  Do not lift anything that is heavier than 10 lb (4.5 kg), or the limit that you are told, until your doctor says that it is safe.  Do not drive or use heavy machinery while taking prescription pain medicine.  Follow your doctor's advice about exercise, driving, and general activities. Return to your normal activities as told by your doctor. Ask your doctor what activities are safe for you. Lifestyle  Do not douche, use tampons, or have sex for at least 6 weeks or as told by your doctor.  Do not drink alcohol until your doctor says it is okay.  Do not use any products that contain nicotine or tobacco, such as cigarettes, e-cigarettes, and chewing tobacco. If you need help quitting, ask your doctor. General instructions   Drink enough fluid to keep your pee (urine) pale yellow.  Do not take baths, swim, or use a hot tub until your doctor approves. Ask your doctor if you may take showers. You may only be allowed to take sponge baths.  Try to have someone at home with you for the first 1-2 weeks to help you with your daily chores at home.  Keep the bandage dry until your doctor says it can be taken off.  Wear tight-fitting (  compression) stockings as told by your health care provider. These stockings help to prevent blood clots and reduce swelling in your legs.  Keep all follow-up visits as told by your doctor. This is important. Contact a doctor if:  You have any of these signs of infection: ? Redness, swelling, or pain around your cut. ? Fluid or blood coming from your cut. ? Warmth coming from your cut. ? Pus or a bad smell coming from your cut. ? Chills or a fever.  Your cut breaks open.  You feel dizzy or light-headed.  You  have pain or bleeding when you pee.  You keep having watery poop (diarrhea).  You keep feeling like you may vomit (nauseous) or keep vomiting.  You have unusual fluid (discharge) coming from your vagina.  You have a rash.  You have any type of reaction to your medicine that is not normal, or you develop an allergy to your medicine.  Your pain medicine does not help. Get help right away if:  You have a fever and your symptoms get worse all of a sudden.  You have very bad pain in your belly (abdomen).  You are short of breath.  You faint.  You have pain, swelling, or redness of your leg.  You bleed a lot from your vagina and notice clumps of blood (clots). Summary  Do not take baths, swim, or use a hot tub until your doctor approves. Ask your doctor if you may take showers. You may only be allowed to take sponge baths.  Do not lift anything that is heavier than 10 lb (4.5 kg), or the limit that you are told, until your doctor says that it is safe.  Follow your doctor's advice about exercise, driving, and general activities. Ask your doctor what activities are safe for you.  Try to have someone at home with you for the first 1-2 weeks to help you with your daily chores at home. This information is not intended to replace advice given to you by your health care provider. Make sure you discuss any questions you have with your health care provider. Document Released: 10/04/2007 Document Revised: 02/27/2018 Document Reviewed: 12/14/2015 Elsevier Patient Education  2020 [employer].

## 2018-12-18 NOTE — Plan of Care (Signed)
  Problem: Education: Goal: Knowledge of General Education information will improve Description: Including pain rating scale, medication(s)/side effects and non-pharmacologic comfort measures Outcome: Progressing   Problem: Health Behavior/Discharge Planning: Goal: Ability to manage health-related needs will improve Outcome: Progressing   Problem: Clinical Measurements: Goal: Ability to maintain clinical measurements within normal limits will improve Outcome: Progressing   Problem: Activity: Goal: Risk for activity intolerance will decrease Outcome: Progressing   Problem: Nutrition: Goal: Adequate nutrition will be maintained Outcome: Progressing   Problem: Elimination: Goal: Will not experience complications related to urinary retention Outcome: Progressing   Problem: Pain Managment: Goal: General experience of comfort will improve Outcome: Progressing   Problem: Safety: Goal: Ability to remain free from injury will improve Outcome: Progressing   Problem: Skin Integrity: Goal: Risk for impaired skin integrity will decrease Outcome: Progressing   

## 2018-12-18 NOTE — Discharge Summary (Signed)
Physician Discharge Summary  Patient ID: Tamara Green MRN: [id number] DOB/AGE: 39-Jan-1982 39 y.o.  Admit date: 12/16/2018 Discharge date: 12/18/2018  Admission Diagnoses: symptomatic fibroids, anemia, morbid obesity  Discharge Diagnoses: same Active Problems:   DUB (dysfunctional uterine bleeding)   Post-operative state   Diabetes [facility])   Discharged Condition: good  Hospital Course: She went into surgery for an anticipated LAVH/BS. The adhesions were too dense to proceed so I proceeded with a TAH/BS/lysis of adhesions. Post operatively, her hbg dropped to 6.4 so I transfused her 2 units of PRBCs. Her post transfusion hbg was 8.1. She was ambulating, voiding, and tolerating po well with flatus. She requested to go home on POD #2.  Consults: None  Significant Diagnostic Studies: labs: as above  Treatments: surgery: as above and transfusion as above  Discharge Exam: Blood pressure 135/84, pulse 80, temperature 98 F (36.7 C), temperature source Oral, resp. rate 18, height 5\' 8"  (1.727 m), weight 108.5 kg, last menstrual period 12/02/2018, SpO2 98 %, not currently breastfeeding. General appearance: alert Resp: clear to auscultation bilaterally Cardio: regular rate and rhythm, S1, S2 normal, no murmur, click, rub or gallop GI: soft, non-tender; bowel sounds normal; no masses,  no organomegaly Incision/Wound: dressing removed, incision healing well  Disposition:    Allergies as of 12/18/2018      Reactions   Percocet [oxycodone-acetaminophen] Hives   Latex Rash      Medication List    TAKE these medications   HumaLOG KwikPen 100 UNIT/ML KwikPen Generic drug: insulin lispro Inject 40 Units into the skin 3 (three) times daily.   HYDROcodone-acetaminophen 5-325 MG tablet Commonly known as: NORCO/VICODIN Take 1-2 tablets by mouth every 4 (four) hours as needed for moderate pain.   HYDROmorphone 2 MG tablet Commonly known as: DILAUDID Take 1 tablet (2 mg  total) by mouth every 4 (four) hours as needed for severe pain.   ibuprofen 800 MG tablet Commonly known as: ADVIL Take 1 tablet (800 mg total) by mouth every 8 (eight) hours.   IRON PO Take 36 mg by mouth daily.   Ozempic (0.25 or 0.5 MG/DOSE) 2 MG/1.5ML Sopn Generic drug: Semaglutide(0.25 or 0.5MG /DOS) Inject 0.25 mg into the skin once a week. Mondays      Follow-up Information    Emily Filbert, MD. Schedule an appointment as soon as possible for a visit in 5 week(s).   Specialty: Obstetrics and Gynecology Contact information: [address] [place] [address] [phone]           Signed: Emily Filbert 12/18/2018, 8:48 AM

## 2018-12-18 NOTE — Progress Notes (Signed)
AVS given and reviewed with pt. Medications delivered to bedside by [facility] of care pharmacy and discussed with pt. All questions answered to satisfaction. Pt verbalized understanding of information given. Pt to be  escorted off the unit with all belongings via wheelchair by volunteer services.

## 2018-12-29 ENCOUNTER — Other Ambulatory Visit: Payer: Self-pay | Admitting: Obstetrics & Gynecology

## 2018-12-29 ENCOUNTER — Other Ambulatory Visit (INDEPENDENT_AMBULATORY_CARE_PROVIDER_SITE_OTHER): Payer: Self-pay

## 2018-12-29 MED ORDER — HYDROCODONE-ACETAMINOPHEN 5-325 MG PO TABS: 1.0000 | ORAL_TABLET | Freq: Four times a day (QID) | ORAL | 0 refills | Status: AC | PRN

## 2018-12-29 NOTE — Progress Notes (Signed)
She called with a request for a few more norco, difficulty walking up and down her stairs. She reports normal bowel and bladder function, denies nausea or fevers. I refilled it for #10. Said no more

## 2019-01-16 ENCOUNTER — Ambulatory Visit (INDEPENDENT_AMBULATORY_CARE_PROVIDER_SITE_OTHER): Payer: Self-pay | Admitting: Obstetrics & Gynecology

## 2019-01-16 ENCOUNTER — Encounter: Payer: Self-pay | Admitting: Obstetrics & Gynecology

## 2019-01-16 ENCOUNTER — Other Ambulatory Visit: Payer: Self-pay

## 2019-01-16 VITALS — BP 134/82 | HR 84 | Wt 239.6 lb

## 2019-01-16 DIAGNOSIS — Z9889 Other specified postprocedural states: Secondary | ICD-10-CM

## 2019-01-16 DIAGNOSIS — R3915 Urgency of urination: Secondary | ICD-10-CM

## 2019-01-16 LAB — POCT URINALYSIS DIP (DEVICE)
Bilirubin Urine: NEGATIVE
Glucose, UA: NEGATIVE mg/dL
Hgb urine dipstick: NEGATIVE
Ketones, ur: NEGATIVE mg/dL
Leukocytes,Ua: NEGATIVE
Nitrite: NEGATIVE
Protein, ur: NEGATIVE mg/dL
Specific Gravity, Urine: 1.025 (ref 1.005–1.030)
Urobilinogen, UA: 0.2 mg/dL (ref 0.0–1.0)
pH: 7 (ref 5.0–8.0)

## 2019-01-16 NOTE — Progress Notes (Signed)
   Subjective:    Patient ID: Tamara Green, female    DOB: [birth year], 39 y.o.   MRN: [id number]  HPI 39 yo lady here for a 4 week post op visit s/p TAH/BS for DUB and severe adhesions noted. She reports some urinary pressure. She reports more normal BMs than prior to surgery. She has some difficulty sleeping.   Review of Systems Pathology benign    Objective:   Physical Exam Breathing, conversing, and ambulating normally Well nourished, well hydrated Black female, no apparent distress Abd- benign, incision healed here Spec exam- cuff healed well Bimanual normal     Assessment & Plan:  Urinary pressure- check ua

## 2021-01-11 ENCOUNTER — Other Ambulatory Visit: Payer: Self-pay | Admitting: Family Medicine

## 2021-01-11 DIAGNOSIS — Z09 Encounter for follow-up examination after completed treatment for conditions other than malignant neoplasm: Secondary | ICD-10-CM

## 2021-02-21 ENCOUNTER — Other Ambulatory Visit: Payer: Self-pay | Admitting: Family Medicine

## 2021-02-21 ENCOUNTER — Ambulatory Visit
Admission: RE | Admit: 2021-02-21 | Discharge: 2021-02-21 | Disposition: A | Discharge status: Home / Self Care | Payer: Medicaid Other | Source: Ambulatory Visit | Attending: Family Medicine | Admitting: Family Medicine

## 2021-02-21 ENCOUNTER — Ambulatory Visit
Admission: RE | Admit: 2021-02-21 | Discharge: 2021-02-21 | Disposition: A | Discharge status: Home / Self Care | Payer: 59 | Source: Ambulatory Visit | Attending: Family Medicine | Admitting: Family Medicine

## 2021-02-21 DIAGNOSIS — Z09 Encounter for follow-up examination after completed treatment for conditions other than malignant neoplasm: Secondary | ICD-10-CM

## 2021-02-21 DIAGNOSIS — N63 Unspecified lump in unspecified breast: Secondary | ICD-10-CM

## 2021-07-30 ENCOUNTER — Other Ambulatory Visit: Payer: Self-pay

## 2021-07-30 ENCOUNTER — Encounter (HOSPITAL_COMMUNITY): Payer: Self-pay

## 2021-07-30 ENCOUNTER — Emergency Department (HOSPITAL_COMMUNITY): Payer: 59

## 2021-07-30 ENCOUNTER — Emergency Department (HOSPITAL_COMMUNITY)
Admission: EM | Admit: 2021-07-30 | Discharge: 2021-07-31 | Disposition: A | Discharge status: Home / Self Care | Payer: 59 | Attending: Emergency Medicine | Admitting: Emergency Medicine

## 2021-07-30 DIAGNOSIS — Z91040 Latex allergy status: Secondary | ICD-10-CM | POA: Diagnosis not present

## 2021-07-30 DIAGNOSIS — E119 Type 2 diabetes mellitus without complications: Secondary | ICD-10-CM | POA: Diagnosis not present

## 2021-07-30 DIAGNOSIS — R112 Nausea with vomiting, unspecified: Secondary | ICD-10-CM | POA: Diagnosis not present

## 2021-07-30 DIAGNOSIS — Z794 Long term (current) use of insulin: Secondary | ICD-10-CM | POA: Insufficient documentation

## 2021-07-30 DIAGNOSIS — E876 Hypokalemia: Secondary | ICD-10-CM | POA: Insufficient documentation

## 2021-07-30 DIAGNOSIS — R42 Dizziness and giddiness: Secondary | ICD-10-CM | POA: Diagnosis present

## 2021-07-30 LAB — BASIC METABOLIC PANEL
Anion gap: 10 (ref 5–15)
BUN: 9 mg/dL (ref 6–20)
CO2: 25 mmol/L (ref 22–32)
Calcium: 9 mg/dL (ref 8.9–10.3)
Chloride: 100 mmol/L (ref 98–111)
Creatinine, Ser: 0.67 mg/dL (ref 0.44–1.00)
GFR, Estimated: >60 mL/min (ref >60)
Glucose, Bld: 246 mg/dL — ABNORMAL HIGH (ref 70–99)
Potassium: 3.2 mmol/L — ABNORMAL LOW (ref 3.5–5.1)
Sodium: 135 mmol/L (ref 135–145)

## 2021-07-30 LAB — CBC
HCT: 35.3 % — ABNORMAL LOW (ref 36.0–46.0)
Hemoglobin: 12.1 g/dL (ref 12.0–15.0)
MCH: 28.1 pg (ref 26.0–34.0)
MCHC: 34.3 g/dL (ref 30.0–36.0)
MCV: 82.1 fL (ref 80.0–100.0)
Platelets: 365 10*3/uL (ref 150–400)
RBC: 4.3 MIL/uL (ref 3.87–5.11)
RDW: 13.8 % (ref 11.5–15.5)
WBC: 8.5 10*3/uL (ref 4.0–10.5)
nRBC: 0 % (ref 0.0–0.2)

## 2021-07-30 LAB — TROPONIN I (HIGH SENSITIVITY)
Troponin I (High Sensitivity): 6 ng/L (ref <18)
Troponin I (High Sensitivity): 5 ng/L (ref <18)

## 2021-07-30 NOTE — ED Triage Notes (Signed)
Reports dizziness x 3 weeks and complaining her hands and feet going numb and tingling. Reports had an episode while driving and had to pull over and have husband come get her and she ended up urinating on self.  Worse with turning head.

## 2021-07-30 NOTE — ED Provider Triage Note (Signed)
Emergency Medicine Provider Triage Evaluation Note  Tamara Green , a 41 y.o. female  was evaluated in triage.  Pt complains of dizziness onset 3 weeks.  Notes that she recently became a vegetarian prior to the onset of her symptoms.  She had episode while driving approximately 2 weeks ago where she urinated on herself and had the episode of lightheadedness.  Notes that her symptoms at that time were worse with turning her head.  Denies any medications prior to arrival.  Denies shortness of breath, palpitations.  Review of Systems  Positive: As per HPI Negative:   Physical Exam  BP (!) 146/90 (BP Location: Right Arm)   Pulse 78   Temp 99.1 F (37.3 C) (Oral)   Resp 18   Ht '5\' 8"'$  (1.727 m)   Wt 113.4 kg   LMP 12/02/2018   SpO2 95%   BMI 38.01 kg/m  Gen:   Awake, no distress   Resp:  Normal effort  MSK:   Moves extremities without difficulty  Other:  No chest wall tenderness to palpation  Medical Decision Making  Medically screening exam initiated at 3:43 PM.  Appropriate orders placed.  Cheralyn A Carr-Williams was informed that the remainder of the evaluation will be completed by another provider, this initial triage assessment does not replace that evaluation, and the importance of remaining in the ED until their evaluation is complete.  Work-up initiated   Christerpher Clos A, PA-C 07/30/21 1544

## 2021-07-31 ENCOUNTER — Emergency Department (HOSPITAL_COMMUNITY): Payer: 59

## 2021-07-31 DIAGNOSIS — R42 Dizziness and giddiness: Secondary | ICD-10-CM | POA: Diagnosis not present

## 2021-07-31 MED ORDER — MECLIZINE HCL 25 MG PO TABS: 25.0000 mg | ORAL_TABLET | Freq: Three times a day (TID) | ORAL | 0 refills | Status: AC | PRN

## 2021-07-31 MED ORDER — DIAZEPAM 2 MG PO TABS: 2.0000 mg | ORAL_TABLET | Freq: Every day | ORAL | 0 refills | Status: AC | PRN

## 2021-07-31 MED ORDER — SODIUM CHLORIDE 0.9 % IV BOLUS
250.0000 mL | Freq: Once | INTRAVENOUS | Status: AC
  Administered 2021-07-31: 250 mL via INTRAVENOUS

## 2021-07-31 MED ORDER — DIAZEPAM 5 MG/ML IJ SOLN
5.0000 mg | Freq: Once | INTRAMUSCULAR | Status: AC
Start: 2021-07-31 — End: 2021-07-31
  Administered 2021-07-31: 5 mg via INTRAVENOUS
  Filled 2021-07-31: qty 2

## 2021-07-31 NOTE — Discharge Instructions (Signed)
Likely you have vertigo, I given you meclizine please take as prescribed, also given you Valium only use if the meclizine is not working, this can make you drowsy so I recommend only taking this at nighttime before bed.   Please follow-up with neurology and/or PCP for reassessment.  Come back to the emergency department if you develop chest pain, shortness of breath, severe abdominal pain, uncontrolled nausea, vomiting, diarrhea.

## 2021-07-31 NOTE — ED Provider Notes (Signed)
[facility] EMERGENCY DEPARTMENT Provider Note   CSN: [id number] Arrival date & time: 07/30/21  1514     History  Chief Complaint  Patient presents with   Dizziness    Tamara Green is a 41 y.o. female.  HPI  Medical history including rheumatoid arthritis, diabetes, presents with complaints of lightheaded dizziness.  Patient has been going on for last 3 weeks, patient is lightheaded dizziness comes on sporadically, describes dizziness as if the room is spinning and feeling off balance, she states it is worsened with head movement, improves while she is at rest and her head is stationary.  She has no associated headaches change in vision paresthesias or weakness of upper or lower extremities.  She denies any chest pain shortness of breath, no peripheral edema she has no cardiac abnormalities no history of PEs or DVTs.  She does note that one-point she had severe dizziness became nauseous and vomited, while vomiting she did urinate, she has not had this happen since.  No seizure-like activities.    Home Medications Prior to Admission medications   Medication Sig Start Date End Date Taking? Authorizing Provider  diazepam (VALIUM) 2 MG tablet Take 1 tablet (2 mg total) by mouth daily as needed for up to 4 doses (Dizziness). 07/31/21  Yes Marcello Fennel, PA-C  meclizine (ANTIVERT) 25 MG tablet Take 1 tablet (25 mg total) by mouth 3 (three) times daily as needed for dizziness. 07/31/21  Yes Marcello Fennel, PA-C  Ferrous Sulfate (IRON PO) Take 36 mg by mouth daily.    [provider]  HUMALOG KWIKPEN 100 UNIT/ML KwikPen Inject 40 Units into the skin 3 (three) times daily.  01/23/18   [provider]  HYDROcodone-acetaminophen (NORCO/VICODIN) 5-325 MG tablet Take 1-2 tablets by mouth every 4 (four) hours as needed for moderate pain. 12/18/18   Emily Filbert, MD  HYDROcodone-acetaminophen (NORCO/VICODIN) 5-325 MG tablet Take 1 tablet by mouth  every 6 (six) hours as needed. 12/29/18   Emily Filbert, MD  HYDROmorphone (DILAUDID) 2 MG tablet Take 1 tablet (2 mg total) by mouth every 4 (four) hours as needed for severe pain. 12/18/18   Emily Filbert, MD  ibuprofen (ADVIL) 800 MG tablet Take 1 tablet (800 mg total) by mouth every 8 (eight) hours. 12/18/18   Emily Filbert, MD  Semaglutide,0.25 or 0.'5MG'$ /DOS, (OZEMPIC, 0.25 OR 0.5 MG/DOSE,) 2 MG/1.5ML SOPN Inject 0.25 mg into the skin once a week. Mondays    [provider]      Allergies    Percocet [oxycodone-acetaminophen] and Latex    Review of Systems   Review of Systems  Constitutional:  Negative for chills and fever.  Respiratory:  Negative for shortness of breath.   Cardiovascular:  Negative for chest pain.  Gastrointestinal:  Negative for abdominal pain.  Neurological:  Positive for dizziness and light-headedness. Negative for headaches.    Physical Exam Updated Vital Signs BP (!) 165/96   Pulse 75   Temp 98.2 F (36.8 C) (Oral)   Resp (!) 25   Ht '5\' 8"'$  (1.727 m)   Wt 113.4 kg   LMP 12/02/2018   SpO2 100%   BMI 38.01 kg/m  Physical Exam Vitals and nursing note reviewed.  Constitutional:      General: She is not in acute distress.    Appearance: She is not ill-appearing.  HENT:     Head: Normocephalic and atraumatic.     Nose: No congestion.  Eyes:  Conjunctiva/sclera: Conjunctivae normal.  Cardiovascular:     Rate and Rhythm: Normal rate and regular rhythm.     Pulses: Normal pulses.     Heart sounds: No murmur heard.    No friction rub. No gallop.  Pulmonary:     Effort: No respiratory distress.     Breath sounds: No wheezing, rhonchi or rales.  Abdominal:     Palpations: Abdomen is soft.     Tenderness: There is no abdominal tenderness. There is no right CVA tenderness or left CVA tenderness.  Skin:    General: Skin is warm and dry.  Neurological:     Mental Status: She is alert.     GCS: GCS eye subscore is 4. GCS verbal subscore is 5.  GCS motor subscore is 6.     Cranial Nerves: Cranial nerves 2-12 are intact.     Motor: No weakness.     Coordination: Romberg sign negative. Finger-Nose-Finger Test normal.     Gait: Gait is intact.     Comments: Cranial nerves II through XII grossly intact no difficulty with word finding, following two-step commands, there is no unilateral weakness present.  Patient is able to stand up under her own accord, was able to take a few steps but states she became dizzy, no truncal instability, no shuffled gait.  Psychiatric:        Mood and Affect: Mood normal.     ED Results / Procedures / Treatments   Labs (all labs ordered are listed, but only abnormal results are displayed) Labs Reviewed  BASIC METABOLIC PANEL - Abnormal; Notable for the following components:      Result Value   Potassium 3.2 (*)    Glucose, Bld 246 (*)    All other components within normal limits  CBC - Abnormal; Notable for the following components:   HCT 35.3 (*)    All other components within normal limits  TROPONIN I (HIGH SENSITIVITY)  TROPONIN I (HIGH SENSITIVITY)    EKG EKG Interpretation  Date/Time:  Sunday July 30 2021 15:36:15 EDT Ventricular Rate:  83 PR Interval:  162 QRS Duration: 88 QT Interval:  392 QTC Calculation: 460 R Axis:   73 Text Interpretation: Normal sinus rhythm Nonspecific T wave abnormality Abnormal ECG When compared with ECG of 14-Jun-2018 14:28, T wave abnormality has improved Confirmed by Delora Fuel ([id number]) on 07/31/2021 2:20:59 AM  Radiology CT Head Wo Contrast  Result Date: 07/31/2021 CLINICAL DATA:  Chronic headache EXAM: CT HEAD WITHOUT CONTRAST TECHNIQUE: Contiguous axial images were obtained from the base of the skull through the vertex without intravenous contrast. RADIATION DOSE REDUCTION: This exam was performed according to the departmental dose-optimization program which includes automated exposure control, adjustment of the mA and/or kV according to patient size  and/or use of iterative reconstruction technique. COMPARISON:  None Available. FINDINGS: Brain: No evidence of acute infarction, hemorrhage, hydrocephalus, extra-axial collection or mass lesion/mass effect. Vascular: No hyperdense vessel or unexpected calcification. Skull: Normal. Negative for fracture or focal lesion. Sinuses/Orbits: The visualized paranasal sinuses are essentially clear. The mastoid air cells are unopacified. Other: None. IMPRESSION: Normal head CT. Electronically Signed   By: Julian Hy M.D.   On: 07/31/2021 01:00   DG Chest 2 View  Result Date: 07/30/2021 CLINICAL DATA:  Dizziness. EXAM: CHEST - 2 VIEW COMPARISON:  None Available. FINDINGS: Cardiac silhouette and mediastinal contours are within normal limits. The lungs are clear. No pleural effusion or pneumothorax. Minimal multilevel degenerative disc changes of the thoracic  spine. IMPRESSION: No active cardiopulmonary disease. Electronically Signed   By: Yvonne Kendall M.D.   On: 07/30/2021 16:29    Procedures Procedures    Medications Ordered in ED Medications  diazepam (VALIUM) injection 5 mg (5 mg Intravenous Given 07/31/21 0138)  sodium chloride 0.9 % bolus 250 mL (250 mLs Intravenous New Bag/Given 07/31/21 0137)    ED Course/ Medical Decision Making/ A&P                           Medical Decision Making Amount and/or Complexity of Data Reviewed Radiology: ordered.  Risk Prescription drug management.   This patient presents to the ED for concern of dizziness, this involves an extensive number of treatment options, and is a complaint that carries with it a high risk of complications and morbidity.  The differential diagnosis includes cerebellar infarct, peripheral vertigo, meningitis, arrhythmias, ACS    Additional history obtained:  Additional history obtained from N/A External records from outside source obtained and reviewed including PCP notes   Co morbidities that complicate the patient  evaluation  Rheumatoid arthritis  Social Determinants of Health:  N/A    Lab Tests:  I Ordered, and personally interpreted labs.  The pertinent results include: CBC unremarkable, BMP shows potassium 3.2 glucose 246, negative delta troponin   Imaging Studies ordered:  I ordered imaging studies including CT head, chest x-ray I independently visualized and interpreted imaging which showed both negative acute findings, CT head negative for acute findings I agree with the radiologist interpretation   Cardiac Monitoring:  The patient was maintained on a cardiac monitor.  I personally viewed and interpreted the cardiac monitored which showed an underlying rhythm of: EKG without signs of ischemia   Medicines ordered and prescription drug management:  I ordered medication including meclizine, fluids I have reviewed the patients home medicines and have made adjustments as needed  Critical Interventions:  N/A   Reevaluation:  Presents with dizziness, triage obtain lab work imaging which I have personally reviewed they are unremarkable, I am concerned for possible intracranial mass, will obtain CT head for further evaluation, provide her with Valium fluids and reassess.  Patient is reassessed states she is feeling better, I set the patient up ambulate she states she feels better than before, she is agreement with plan discharge at this time.   Consultations Obtained:  N/A   Test Considered:  MRI brain-deferred as my suspicion for cerebellar infarct is low at this time, presentation is atypical, more consistent with vertigo.    Rule out I have low suspicion for ACS as history is atypical, patient has no cardiac history, EKG was sinus rhythm without signs of ischemia, patient had negative delta troponin.  Low suspicion for PE as patient denies pleuritic chest pain, shortness of breath, patient denies leg pain, no pedal edema noted on exam, vital signs reassuring  nontachypneic nonhypoxic nontachycardic.  low suspicion for AAA or aortic dissection as history is atypical, patient has low risk factors.  Low suspicion for systemic infection as patient is nontoxic-appearing, vital signs reassuring, no obvious source infection noted on exam. low suspicion for internal head bleed and or mass as CT imaging is negative for acute findings.  Low suspicion for CVA she has no focal deficit present my exam.  Low suspicion for dissection of the vertebral or carotid artery as presentation atypical of etiology.  Low suspicion for meningitis as she has no meningeal sign present.  Dispostion and problem list  After consideration of the diagnostic results and the patients response to treatment, I feel that the patent would benefit from discharge.  Lightheadedness-seems consistent with likely vertigo as it is positional associate with head movement, intermittent will have occasional tinnitus in the ear, will provide her with Valium, meclizine, follow-up with PCP and neurology for further evaluation.            Final Clinical Impression(s) / ED Diagnoses Final diagnoses:  Dizziness    Rx / DC Orders ED Discharge Orders          Ordered    diazepam (VALIUM) 2 MG tablet  Daily PRN        07/31/21 0229    meclizine (ANTIVERT) 25 MG tablet  3 times daily PRN        07/31/21 0229              Marcello Fennel, PA-C 80/32/12 [id number]    Delora Fuel, MD 50/03/70 [phone]

## 2021-07-31 NOTE — ED Notes (Signed)
Pt states she has been trying to "get healthy by changing my diet over the past month.... I'm even a vegetarian now."  Pt states she has felt weak and lethargic over the past couple of weeks.  She has had many dizzy spells reporting that she has "even had to get my son and husband to come and pick me up b/c I did not feel safe to drive."    Pt has an appointment w/ her PCP tomorrow however she stated "I just could not wait, I felt so off."

## 2022-03-26 ENCOUNTER — Other Ambulatory Visit: Payer: Self-pay | Admitting: Family Medicine

## 2022-03-26 DIAGNOSIS — Z1231 Encounter for screening mammogram for malignant neoplasm of breast: Secondary | ICD-10-CM

## 2022-05-09 ENCOUNTER — Ambulatory Visit
Admission: RE | Admit: 2022-05-09 | Discharge: 2022-05-09 | Disposition: A | Discharge status: Home / Self Care | Payer: 59 | Source: Ambulatory Visit | Attending: Family Medicine | Admitting: Family Medicine

## 2022-05-09 DIAGNOSIS — Z1231 Encounter for screening mammogram for malignant neoplasm of breast: Secondary | ICD-10-CM

## 2023-07-24 ENCOUNTER — Other Ambulatory Visit: Payer: Self-pay

## 2023-07-25 LAB — SURGICAL PATHOLOGY
